# Patient Record
Sex: Female | Born: 1955 | Race: White | Hispanic: No | Marital: Married | Smoking: Never smoker
Health system: Southern US, Community
[De-identification: ages and names within clinical notes are randomized; demographics above are authoritative.]

## PROBLEM LIST (undated history)

## (undated) DIAGNOSIS — E569 Vitamin deficiency, unspecified: Secondary | ICD-10-CM

## (undated) DIAGNOSIS — Z789 Other specified health status: Secondary | ICD-10-CM

## (undated) DIAGNOSIS — C50919 Malignant neoplasm of unspecified site of unspecified female breast: Secondary | ICD-10-CM

## (undated) DIAGNOSIS — R232 Flushing: Secondary | ICD-10-CM

## (undated) HISTORY — PX: TONSILLECTOMY: SUR1361

## (undated) HISTORY — DX: Flushing: R23.2

## (undated) HISTORY — DX: Vitamin deficiency, unspecified: E56.9

## (undated) HISTORY — PX: TUBAL LIGATION: SHX77

## (undated) HISTORY — DX: Malignant neoplasm of unspecified site of unspecified female breast: C50.919

## (undated) HISTORY — PX: BREAST ENHANCEMENT SURGERY: SHX7

---

## 1986-02-12 HISTORY — PX: DILATION AND CURETTAGE OF UTERUS: SHX78

## 1997-06-24 ENCOUNTER — Ambulatory Visit (HOSPITAL_COMMUNITY): Admission: RE | Admit: 1997-06-24 | Discharge: 1997-06-24 | Payer: Self-pay | Admitting: Obstetrics and Gynecology

## 1997-11-25 ENCOUNTER — Other Ambulatory Visit: Admission: RE | Admit: 1997-11-25 | Discharge: 1997-11-25 | Payer: Self-pay | Admitting: Obstetrics and Gynecology

## 1999-03-20 ENCOUNTER — Other Ambulatory Visit: Admission: RE | Admit: 1999-03-20 | Discharge: 1999-03-20 | Payer: Self-pay | Admitting: Obstetrics and Gynecology

## 1999-05-01 ENCOUNTER — Other Ambulatory Visit: Admission: RE | Admit: 1999-05-01 | Discharge: 1999-05-01 | Payer: Self-pay | Admitting: Obstetrics and Gynecology

## 1999-07-04 ENCOUNTER — Ambulatory Visit (HOSPITAL_COMMUNITY): Admission: RE | Admit: 1999-07-04 | Discharge: 1999-07-04 | Payer: Self-pay | Admitting: Obstetrics and Gynecology

## 2000-04-23 ENCOUNTER — Encounter: Payer: Self-pay | Admitting: Obstetrics and Gynecology

## 2000-04-23 ENCOUNTER — Ambulatory Visit (HOSPITAL_COMMUNITY): Admission: RE | Admit: 2000-04-23 | Discharge: 2000-04-23 | Payer: Self-pay | Admitting: Obstetrics and Gynecology

## 2006-02-20 ENCOUNTER — Other Ambulatory Visit: Admission: RE | Admit: 2006-02-20 | Discharge: 2006-02-20 | Payer: Self-pay | Admitting: Obstetrics & Gynecology

## 2011-09-06 ENCOUNTER — Other Ambulatory Visit: Payer: Self-pay | Admitting: Obstetrics and Gynecology

## 2011-09-06 DIAGNOSIS — N63 Unspecified lump in unspecified breast: Secondary | ICD-10-CM

## 2011-09-11 ENCOUNTER — Other Ambulatory Visit: Payer: Self-pay | Admitting: Obstetrics and Gynecology

## 2011-09-11 ENCOUNTER — Ambulatory Visit
Admission: RE | Admit: 2011-09-11 | Discharge: 2011-09-11 | Disposition: A | Discharge status: Home / Self Care | Payer: PRIVATE HEALTH INSURANCE | Source: Ambulatory Visit | Attending: Obstetrics and Gynecology | Admitting: Obstetrics and Gynecology

## 2011-09-11 DIAGNOSIS — N63 Unspecified lump in unspecified breast: Secondary | ICD-10-CM

## 2011-09-20 ENCOUNTER — Other Ambulatory Visit: Payer: Self-pay | Admitting: Obstetrics and Gynecology

## 2011-09-20 ENCOUNTER — Ambulatory Visit
Admission: RE | Admit: 2011-09-20 | Discharge: 2011-09-20 | Disposition: A | Discharge status: Home / Self Care | Payer: PRIVATE HEALTH INSURANCE | Source: Ambulatory Visit | Attending: Obstetrics and Gynecology | Admitting: Obstetrics and Gynecology

## 2011-09-20 ENCOUNTER — Other Ambulatory Visit: Payer: Self-pay | Admitting: Diagnostic Radiology

## 2011-09-20 DIAGNOSIS — N63 Unspecified lump in unspecified breast: Secondary | ICD-10-CM

## 2011-09-20 DIAGNOSIS — C50919 Malignant neoplasm of unspecified site of unspecified female breast: Secondary | ICD-10-CM

## 2011-09-20 HISTORY — DX: Malignant neoplasm of unspecified site of unspecified female breast: C50.919

## 2011-09-20 HISTORY — PX: BREAST BIOPSY: SHX20

## 2011-09-21 ENCOUNTER — Other Ambulatory Visit: Payer: Self-pay | Admitting: Obstetrics and Gynecology

## 2011-09-21 ENCOUNTER — Ambulatory Visit
Admission: RE | Admit: 2011-09-21 | Discharge: 2011-09-21 | Disposition: A | Discharge status: Home / Self Care | Payer: PRIVATE HEALTH INSURANCE | Source: Ambulatory Visit | Attending: Obstetrics and Gynecology | Admitting: Obstetrics and Gynecology

## 2011-09-21 DIAGNOSIS — N63 Unspecified lump in unspecified breast: Secondary | ICD-10-CM

## 2011-09-21 DIAGNOSIS — C50911 Malignant neoplasm of unspecified site of right female breast: Secondary | ICD-10-CM

## 2011-09-24 ENCOUNTER — Telehealth: Payer: Self-pay | Admitting: *Deleted

## 2011-09-24 ENCOUNTER — Other Ambulatory Visit: Payer: Self-pay | Admitting: *Deleted

## 2011-09-24 DIAGNOSIS — C50419 Malignant neoplasm of upper-outer quadrant of unspecified female breast: Secondary | ICD-10-CM | POA: Insufficient documentation

## 2011-09-24 NOTE — Telephone Encounter (Signed)
Pt stated she would like to choose her own doctors.  Pt would like to see Dr. Jamey Ripa as her surgeon.  Informed [facility] of pt request.

## 2011-09-24 NOTE — Telephone Encounter (Signed)
Pt called stating she would like to come to clinic on 09/26/11 instead of waiting to see Dr. Jamey Ripa on 9/4.  Scheduled pt to come on 8/14 to [facility] at 0800

## 2011-09-25 ENCOUNTER — Ambulatory Visit
Admission: RE | Admit: 2011-09-25 | Discharge: 2011-09-25 | Disposition: A | Discharge status: Home / Self Care | Payer: PRIVATE HEALTH INSURANCE | Source: Ambulatory Visit | Attending: Obstetrics and Gynecology | Admitting: Obstetrics and Gynecology

## 2011-09-25 DIAGNOSIS — C50911 Malignant neoplasm of unspecified site of right female breast: Secondary | ICD-10-CM

## 2011-09-25 MED ORDER — GADOBENATE DIMEGLUMINE 529 MG/ML IV SOLN
11.0000 mL | Freq: Once | INTRAVENOUS | Status: AC | PRN
  Administered 2011-09-25: 11 mL via INTRAVENOUS

## 2011-09-26 ENCOUNTER — Ambulatory Visit: Payer: PRIVATE HEALTH INSURANCE

## 2011-09-26 ENCOUNTER — Ambulatory Visit (HOSPITAL_BASED_OUTPATIENT_CLINIC_OR_DEPARTMENT_OTHER): Payer: PRIVATE HEALTH INSURANCE | Admitting: Oncology

## 2011-09-26 ENCOUNTER — Ambulatory Visit: Payer: PRIVATE HEALTH INSURANCE | Attending: General Surgery | Admitting: Physical Therapy

## 2011-09-26 ENCOUNTER — Encounter: Payer: Self-pay | Admitting: *Deleted

## 2011-09-26 ENCOUNTER — Ambulatory Visit
Admission: RE | Admit: 2011-09-26 | Discharge: 2011-09-26 | Disposition: A | Discharge status: Home / Self Care | Payer: PRIVATE HEALTH INSURANCE | Source: Ambulatory Visit | Attending: Radiation Oncology | Admitting: Radiation Oncology

## 2011-09-26 ENCOUNTER — Encounter: Payer: Self-pay | Admitting: Oncology

## 2011-09-26 ENCOUNTER — Other Ambulatory Visit: Payer: PRIVATE HEALTH INSURANCE | Admitting: Lab

## 2011-09-26 ENCOUNTER — Ambulatory Visit: Payer: PRIVATE HEALTH INSURANCE | Admitting: Oncology

## 2011-09-26 ENCOUNTER — Ambulatory Visit (HOSPITAL_BASED_OUTPATIENT_CLINIC_OR_DEPARTMENT_OTHER): Payer: PRIVATE HEALTH INSURANCE | Admitting: General Surgery

## 2011-09-26 ENCOUNTER — Other Ambulatory Visit (HOSPITAL_BASED_OUTPATIENT_CLINIC_OR_DEPARTMENT_OTHER): Payer: PRIVATE HEALTH INSURANCE | Admitting: Lab

## 2011-09-26 VITALS — BP 121/71 | HR 77 | Temp 97.7°F | Resp 20 | Ht 64.0 in | Wt 122.1 lb

## 2011-09-26 DIAGNOSIS — C50419 Malignant neoplasm of upper-outer quadrant of unspecified female breast: Secondary | ICD-10-CM

## 2011-09-26 DIAGNOSIS — C50919 Malignant neoplasm of unspecified site of unspecified female breast: Secondary | ICD-10-CM

## 2011-09-26 DIAGNOSIS — Z170 Estrogen receptor positive status [ER+]: Secondary | ICD-10-CM

## 2011-09-26 DIAGNOSIS — IMO0001 Reserved for inherently not codable concepts without codable children: Secondary | ICD-10-CM | POA: Insufficient documentation

## 2011-09-26 DIAGNOSIS — M25619 Stiffness of unspecified shoulder, not elsewhere classified: Secondary | ICD-10-CM | POA: Insufficient documentation

## 2011-09-26 LAB — COMPREHENSIVE METABOLIC PANEL
ALT: 23 U/L (ref 0–35)
AST: 23 U/L (ref 0–37)
Albumin: 4 g/dL (ref 3.5–5.2)
Alkaline Phosphatase: 73 U/L (ref 39–117)
Glucose, Bld: 98 mg/dL (ref 70–99)
Potassium: 3.6 mEq/L (ref 3.5–5.3)
Sodium: 139 mEq/L (ref 135–145)
Total Bilirubin: 0.5 mg/dL (ref 0.3–1.2)
Total Protein: 7 g/dL (ref 6.0–8.3)

## 2011-09-26 LAB — CBC WITH DIFFERENTIAL/PLATELET
BASO%: 1 % (ref 0.0–2.0)
EOS%: 3.1 % (ref 0.0–7.0)
Eosinophils Absolute: 0.2 10*3/uL (ref 0.0–0.5)
LYMPH%: 29.6 % (ref 14.0–49.7)
MCHC: 34.9 g/dL (ref 31.5–36.0)
MCV: 90.9 fL (ref 79.5–101.0)
MONO%: 6.7 % (ref 0.0–14.0)
NEUT#: 3.3 10*3/uL (ref 1.5–6.5)
Platelets: 225 10*3/uL (ref 145–400)
RBC: 4.81 10*6/uL (ref 3.70–5.45)
RDW: 12.4 % (ref 11.2–14.5)

## 2011-09-26 NOTE — Progress Notes (Signed)
Radiation Oncology         [phone] ________________________________  Initial outpatient Consultation  Name: Andrea Garrett MRN: [id number]  Date: 09/26/2011  DOB: [birth year]  REFERRING PHYSICIAN: Mariella Saa, MD  DIAGNOSIS: Right breast Cancer (Stage II)  HISTORY OF PRESENT ILLNESS::Andrea Garrett is a 56 y.o. female  who presents with a palpable right breast mass. A mammogram was performed which showed subpectoral implants and a 1.9 cm mass. MRI of the bilateral breasts was performed which confirmed the presence of a 2.4 cm mass in the upper outer quadrant measuring 2.4 x 2.2 x 1.4 cm. The mass was located 6 mm anterior to the implant. No other abnormalities were noted within the right or left breast. No lymphadenopathy. 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 She states she's had a little soreness from her biopsy but other than a asymptomatic. She denies any headaches bone pain or lower sternum and the edema. She is accompanied by her husband. Her husband did state she is very afraid of the effects of chemotherapy and radiation on normal tissues.  PREVIOUS RADIATION THERAPY: No  PAST MEDICAL HISTORY:  has a past medical history of Breast cancer and Hot flashes.    PAST SURGICAL HISTORY: Past Surgical History  Procedure Date  . Tonsillectomy   . Cesarean section     x2  . Breast enhancement surgery     Bilateral    FAMILY HISTORY: family history includes Breast cancer in her maternal aunt.  SOCIAL HISTORY:  reports that she has never smoked. She does not have any smokeless tobacco history on file. She reports that she does not drink alcohol or use illicit drugs.  ALLERGIES: Review of patient's allergies indicates no known allergies.  MEDICATIONS:  Current Outpatient Prescriptions  Medication Sig Dispense Refill  . estrogens,  conjugated, (PREMARIN) 0.3 MG tablet Take 0.3 mg by mouth daily. Take daily for 21 days then do not take for 7 days.        REVIEW OF SYSTEMS:  A 15 point review of systems is documented in the electronic medical record. This was obtained by the nursing staff. However, I reviewed this with the patient to discuss relevant findings and make appropriate changes.  Pertinent items are noted in HPI.   PHYSICAL EXAM:  vitals were not taken for this visit.  She is a pleasant female who appears her stated age sitting comfortably examining table. She has bilateral implants. There is a palpable right breast mass at approximately the 11 to 12:00 position of the right breast. There is no palpable axillary or supraclavicular adenopathy.  LABORATORY DATA:  Lab Results  Component Value Date   WBC 5.5 09/26/2011   HGB 15.2 09/26/2011   HCT 43.7 09/26/2011   MCV 90.9 09/26/2011   PLT 225 09/26/2011   Lab Results  Component Value Date   NA 139 09/26/2011   K 3.6 09/26/2011   CL 101 09/26/2011   CO2 29 09/26/2011   Lab Results  Component Value Date   ALT 23 09/26/2011   AST 23 09/26/2011   ALKPHOS 73 09/26/2011   BILITOT 0.5 09/26/2011     RADIOGRAPHY: US Breast Right  09/11/2011  *RADIOLOGY REPORT*  Clinical Data:  Palpable lump in the upper right breast.  Annual reevaluation, left breast.  DIGITAL DIAGNOSTIC BILATERAL MAMMOGRAM WITH CAD  AND RIGHT BREAST ULTRASOUND:  Comparison:  04/23/2000.  No interval mammography.  Findings:  Standard and implant displaced CC and MLO views of both breasts and a spot tangential view of the right breast in the area of palpable concern were obtained.  In the area of palpable concern there is an approximate 2 cm mass with poorly circumscribed margins, new since the prior examination.  No new mass, nonsurgical architectural distortion, or suspicious calcifications were identified elsewhere in either breast.  No evidence of implant rupture. Mammographic images were processed with CAD.   On physical exam, I palpate a firm mass in the 12 o'clock position of the right breast, approximately 5 cm from the nipple.  Ultrasound is performed, showing a vague heterogeneous solid mass with poorly visible margins at the 12 o'clock position of the right breast, 5 cm from the nipple, sitting upon the right pectoralis major muscle.  There is no associated acoustic shadowing.  The mass measures approximately 1.9 x 1.0 x 1.7 cm.  IMPRESSION:  1.  Indeterminate mass at the 12 o'clock position of the right breast, 5 cm from the nipple, corresponding to the palpable abnormality. 2.  No mammographic evidence of malignancy, left breast.  RECOMMENDATION: Ultrasound-guided core needle biopsy of the right breast mass versus short-term interval follow-up was discussed with the patient and her husband.  The patient has elected to proceed with biopsy which has been scheduled for Thursday, August 8, at 10 o'clock a.m.  BI-RADS CATEGORY 4:  Suspicious abnormality - biopsy should be considered.  Results were discussed by telephone with Dr. Tresa Res on 09/11/2011 at 1205 hours.  Original Report Authenticated By: Arnell Sieving, M.D.   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  Presurgical evaluation.  BILATERAL BREAST MRI WITH AND WITHOUT CONTRAST  Technique: Multiplanar, multisequence MR images of both breasts were obtained prior to and following the intravenous administration of 11ml of Multihance.  Three dimensional images were evaluated at the independent DynaCad workstation.  Comparison:  Mammograms dated 09/20/2011 and 09/11/2011.  Findings: There is mild to moderate diffuse bilateral parenchymal background enhancement.  There are bilateral submuscular silicone breast implants present.  There is intracapsular rupture of the left breast implant noted.  There is an irregular enhancing mass located within the  right breast at the 12 o'clock position with central clip artifact corresponding to the recently diagnosed invasive ductal carcinoma with DCIS.  This measures 2.4 x 2.2 x 1.4 cm in size and is associated with plateau enhancement kinetics.  The mass is located 6 mm anterior to the breast implant.  There are no additional worrisome enhancing foci within either breast and there is no evidence for axillary or internal mammary adenopathy.  There are no additional findings.  IMPRESSION:  1.  2.4 cm enhancing mass located within the right breast at the 12 o'clock position with central clip artifact corresponding to the recently diagnosed invasive ductal carcinoma with DCIS.  No evidence for axillary or internal mammary adenopathy.  2.  Bilateral subpectoral silicone breast implants with findings consistent with intracapsular rupture of the left breast implant.  RECOMMENDATION: Treatment plan  THREE-DIMENSIONAL MR IMAGE RENDERING ON INDEPENDENT WORKSTATION:  Three-dimensional MR images were rendered by post-processing of the original MR data on an independent workstation.  The three- dimensional MR images were interpreted, and findings were reported in the accompanying complete MRI report for this study.  BI-RADS CATEGORY 6:  Known biopsy-proven malignancy - appropriate action should be taken.  Original Report Authenticated By: Rolla Plate, M.D.   [place] Core Biopsy  09/20/2011  *RADIOLOGY REPORT*  Clinical Data:  Palpable right breast mass.  ULTRASOUND GUIDED CORE BIOPSY OF THE right BREAST  Comparison: Previous exams.  I met with the patient and we discussed the procedure of ultrasound- guided biopsy, including benefits and alternatives.  We discussed the high likelihood of a successful procedure. We discussed the risks of the procedure, including infection, bleeding, tissue injury, clip migration, and inadequate sampling.  Informed written consent was given.  Using sterile technique 2% lidocaine, ultrasound guidance  and a 14 gauge automated biopsy device, biopsy was performed of the mass using a lateral approach.  At the conclusion of the procedure a ribbon tissue marker clip was deployed into the biopsy cavity. Follow up 2 view mammogram was performed and dictated separately.  IMPRESSION: Ultrasound guided biopsy of a right breast mass.  No apparent complications.  Original Report Authenticated By: Hiram Gash, M.D.   Mm Digital Diagnostic Bilat  09/11/2011  *RADIOLOGY REPORT*  Clinical Data:  Palpable lump in the upper right breast.  Annual reevaluation, left breast.  DIGITAL DIAGNOSTIC BILATERAL MAMMOGRAM WITH CAD AND RIGHT BREAST ULTRASOUND:  Comparison:  04/23/2000.  No interval mammography.  Findings:  Standard and implant displaced CC and MLO views of both breasts and a spot tangential view of the right breast in the area of palpable concern were obtained.  In the area of palpable concern there is an approximate 2 cm mass with poorly circumscribed margins, new since the prior examination.  No new mass, nonsurgical architectural distortion, or suspicious calcifications were identified elsewhere in either breast.  No evidence of implant rupture. Mammographic images were processed with CAD.  On physical exam, I palpate a firm mass in the 12 o'clock position of the right breast, approximately 5 cm from the nipple.  Ultrasound is performed, showing a vague heterogeneous solid mass with poorly visible margins at the 12 o'clock position of the right breast, 5 cm from the nipple, sitting upon the right pectoralis major muscle.  There is no associated acoustic shadowing.  The mass measures approximately 1.9 x 1.0 x 1.7 cm.  IMPRESSION:  1.  Indeterminate mass at the 12 o'clock position of the right breast, 5 cm from the nipple, corresponding to the palpable abnormality. 2.  No mammographic evidence of malignancy, left breast.  RECOMMENDATION: Ultrasound-guided core needle biopsy of the right breast mass versus  short-term interval follow-up was discussed with the patient and her husband.  The patient has elected to proceed with biopsy which has been scheduled for Thursday, August 8, at 10 o'clock a.m.  BI-RADS CATEGORY 4:  Suspicious abnormality - biopsy should be considered.  Results were discussed by telephone with Dr. Tresa Res on 09/11/2011 at 1205 hours.  Original Report Authenticated By: Arnell Sieving, M.D.   Mm Digital Diagnostic Unilat R  09/20/2011  *RADIOLOGY REPORT*  Clinical Data:  Right breast mass  DIGITAL DIAGNOSTIC RIGHT MAMMOGRAM  Comparison:  Previous exams.  Findings:  Films are performed following ultrasound guided biopsy of right breast mass.  Images show a ribbon type clip in association with a mass in the upper central aspect of the right breast.  IMPRESSION: Adequate clip placement following ultrasound guided right breast biopsy.  Original Report Authenticated By: Hiram Gash, M.D.   Mm Radiologist Eval And Mgmt  09/24/2011  **ADDENDUM** CREATED: 09/24/2011 13:59:05  Final pathology demonstrates both  INVASIVE AND IN SITU DUCTAL CARCINOMA. Histology correlates with imaging findings.  These results were given to the patient by phone which she understood. Her questions were answered. The patient had no complaints with her biopsy site.  Recommend surgery - oncology consultation.  The patient is deciding whether she will attend the [facility]isciplinary [facility] on 09/26/11 or a scheduled appointment with Dr. Jamey Ripa on 10/17/2011.  **END ADDENDUM** SIGNED BY: Tinnie Gens T. Si Gaul, M.D.   09/21/2011  *RADIOLOGY REPORT*  ESTABLISHED PATIENT OFFICE VISIT - LEVEL II [phone])  Chief Complaint:  Right breast mass.  History:  Right breast mass which the patient underwent an ultrasound guided right breast biopsy.  Exam:  The biopsy site is clean and dry with an area of adjacent ecchymosis measuring less than 1 cm.  No erythema is present.  The the patient reports no tenderness.  Pathology: Pathology reveals at  least invasive ductal carcinoma in situ, concordant with imaging findings.  The deeper levels are being done to evaluate for invasive disease.  Assessment and Plan:  A breast MRI is scheduled for 09/25/2011 at 8:45 a.m.  The patient will be seen at Multidisciplinary Clinic on 09/26/2011.  Original Report Authenticated By: Rosendo Gros, M.D.      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 We discussed the randomized trial showing equivalency in terms of survival between radiation and mastectomy. We discussed the role of radiation and decreasing local failures after lumpectomy. We discussed the treatment of normal breast tissue which may have cells which are susceptible to becoming cancers. We discussed that chemotherapy decisions were made based on the likelihood of cancer spreading to other parts of the body. She is going to Dr. to Dr. Johna Sheriff about the need for neoadjuvant treatment. She does appear to have very little breast tissue so she may require some form of neoadjuvant treatment to make her surgery better. She is very afraid of the effects on normal tissue of chemotherapy and radiation. We talked a lot about fractionation and palliative treatments over 6 weeks not because his more radiation but it does allow the process of normal cells to heal. If she does not wish to receive radiation I told her she have a mastectomy and possibly immediate reconstruction. Again this would not preclude any decisions regarding systemic therapy. Her tumor will be suffered Oncotype she does not like for neoadjuvant treatment. We discussed lung heart and rib damage. We discussed the low likelihood of these effects. We discussed skin reaction and fibrosis. She does have one maternal aunt with breast cancer at 67 but I do not feel she would benefit from genetic testing. She has 2 sons and again not a very extensive family history. This point I  think she is undecided is what she would like to do. I told her I be happy to see her back after surgery we'll start radiation about 4 weeks after surgery. We briefly discussed hypofractionation and we discussed that this wasn't last treatment it was just the same treatment in a short amount of time.  I spent 60 minutes  face to face with the patient and more than 50% of that time was spent in counseling and/or coordination of care.   ------------------------------------------------  Lurline Hare, MD

## 2011-09-26 NOTE — Progress Notes (Signed)
Andrea Garrett [id number] 09/08/1955 56 y.o. 09/26/2011 11:43 AM  CC Dr Aram Beecham Romine  REASON FOR CONSULTATION:   Breast cancer Patient was seen in the Multidisciplinary Breast Clinic for discussion of her treatment options. She was seen by Dr. Pierce Crane , Radiation Onc[facility]  STAGE:   Cancer of upper-outer quadrant of female breast   Primary site: Breast (Right)   Staging method: AJCC 7th Edition   Clinical: Stage IA (T1c, N0, cM0)   Summary: Stage IA (T1c, N0, cM0)  REFERRING PHYSICIAN: Dr. Aram Beecham Romine  HISTORY OF PRESENT ILLNESS:  Andrea Garrett is a 56 y.o. female.  From [facility] who is referred for evaluation of a recently diagnosed breast cancer. This patient has been in excellent health. She has not had a mammogram in some time or at least for 10 years. She detected a mass in her right breast about a month ago. She subsequently sought medical attention for this he diagnostic mammogram right breast ultrasound performed 09/11/2011 showed a mass at the 12:00 position 5 cm from nipple measuring 1.9 x 1.7 x 1 cm. A biopsy was performed on 09/20/2011 and this showed a invasive ductal cancer, grade 1/2. This was ER +90% PR +90% and HER-2 was nonamplified. Ki-67 was low. An MRI scan was performed on 09/25/2011 and this showed a mass measuring 2.4 x 2.2 x 1.4 cm. No other abnormalities were seen.   Past Medical History: Past Medical History  Diagnosis Date  . Breast cancer   . Hot flashes    She is in excellent health and does not have a colonoscopy. Her bone density test is out of date as well. She has had a Pap smear in 2012. Past Surgical History: Past Surgical History  Procedure Date  . Tonsillectomy   . Cesarean section     x2  . Breast enhancement surgery 1982    Bilateral    Family History: Family History  Problem Relation Age of Onset  . Breast cancer Maternal Aunt 58    Social History History  Substance Use  Topics  . Smoking status: Never Smoker   . Smokeless tobacco: Not on file  . Alcohol Use: No   she has been married for 35 years they have 2 children ages 50 and 38 , ). 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  Allergies: No Known Allergies  Current Medications: Current Outpatient Prescriptions  Medication Sig Dispense Refill  . estrogens, conjugated, (PREMARIN) 0.3 MG tablet Take 0.3 mg by mouth daily. Take daily for 21 days then do not take for 7 days.        OB/GYN History: G3 P2 menarche age 47, last menses 2007 has been on Premarin vaginal cream since then for mostly for vaginal dryness.   Fertility Discussion:NA  Prior History of Cancer:NA  Health Maintenance:  Colonoscopy no Bone Density   no Last PAP smear 2012  ECOG PERFORMANCE STATUS: 0 - Asymptomatic  Genetic Counseling/testing: Referred  REVIEW OF SYSTEMS:  A comprehensive review of systems was negative.  PHYSICAL EXAMINATION: Blood pressure 121/71, pulse 77, temperature 97.7 F (36.5 C), resp. rate 20, height 5\' 4"  (1.626 m), weight 122 lb 1.6 oz (55.384 kg).  HEENT:  Sclerae anicteric, conjunctivae pink.  Oropharynx clear.  No mucositis or candidiasis.  Nodes:  No cervical, supraclavicular, or axillary lymphadenopathy palpated.  Breast Exam:  Right breast has ecchymoses and a vague palpable mass measuring about 2  cm. .  Left breast is benign.  No masses, discharge, skin change, or nipple inversion..  Lungs:  Clear to auscultation bilaterally.  No crackles, rhonchi, or wheezes.  Heart:  Regular rate and rhythm.  Abdomen:  Soft, nontender.  Positive bowel sounds.  No organomegaly or masses palpated.  Musculoskeletal:  No focal spinal tenderness to palpation.  Extremities:  Benign.  No peripheral edema or cyanosis.  Skin:  Benign.  Neuro:  Nonfocal.      STUDIES/RESULTS: US Breast Right  09/11/2011  *RADIOLOGY REPORT*  Clinical Data:  Palpable lump in the upper right  breast.  Annual reevaluation, left breast.  DIGITAL DIAGNOSTIC BILATERAL MAMMOGRAM WITH CAD AND RIGHT BREAST ULTRASOUND:  Comparison:  04/23/2000.  No interval mammography.  Findings:  Standard and implant displaced CC and MLO views of both breasts and a spot tangential view of the right breast in the area of palpable concern were obtained.  In the area of palpable concern there is an approximate 2 cm mass with poorly circumscribed margins, new since the prior examination.  No new mass, nonsurgical architectural distortion, or suspicious calcifications were identified elsewhere in either breast.  No evidence of implant rupture. Mammographic images were processed with CAD.  On physical exam, I palpate a firm mass in the 12 o'clock position of the right breast, approximately 5 cm from the nipple.  Ultrasound is performed, showing a vague heterogeneous solid mass with poorly visible margins at the 12 o'clock position of the right breast, 5 cm from the nipple, sitting upon the right pectoralis major muscle.  There is no associated acoustic shadowing.  The mass measures approximately 1.9 x 1.0 x 1.7 cm.  IMPRESSION:  1.  Indeterminate mass at the 12 o'clock position of the right breast, 5 cm from the nipple, corresponding to the palpable abnormality. 2.  No mammographic evidence of malignancy, left breast.  RECOMMENDATION: Ultrasound-guided core needle biopsy of the right breast mass versus short-term interval follow-up was discussed with the patient and her husband.  The patient has elected to proceed with biopsy which has been scheduled for Thursday, August 8, at 10 o'clock a.m.  BI-RADS CATEGORY 4:  Suspicious abnormality - biopsy should be considered.  Results were discussed by telephone with Dr. Tresa Res on 09/11/2011 at 1205 hours.  Original Report Authenticated By: Arnell Sieving, M.D.   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  Presurgical evaluation.  BILATERAL BREAST MRI WITH AND WITHOUT CONTRAST  Technique: Multiplanar, multisequence MR images of both breasts were obtained prior to and following the intravenous administration of 11ml of Multihance.  Three dimensional images were evaluated at the independent DynaCad workstation.  Comparison:  Mammograms dated 09/20/2011 and 09/11/2011.  Findings: There is mild to moderate diffuse bilateral parenchymal background enhancement.  There are bilateral submuscular silicone breast implants present.  There is intracapsular rupture of the left breast implant noted.  There is an irregular enhancing mass located within the right breast at the 12 o'clock position with central clip artifact corresponding to the recently diagnosed invasive ductal carcinoma with DCIS.  This measures 2.4 x 2.2 x 1.4 cm in size and is associated with plateau enhancement kinetics.  The mass is located 6 mm anterior to the breast implant.  There are no additional worrisome enhancing foci within either breast and there is no evidence for axillary or internal mammary adenopathy.  There are no additional findings.  IMPRESSION:  1.  2.4 cm enhancing mass located within the right breast at the 12 o'clock position with central clip artifact corresponding to the recently diagnosed invasive ductal carcinoma with DCIS.  No evidence for axillary or internal mammary adenopathy.  2.  Bilateral subpectoral silicone breast implants with findings consistent with intracapsular rupture of the left breast implant.  RECOMMENDATION: Treatment plan  THREE-DIMENSIONAL MR IMAGE RENDERING ON INDEPENDENT WORKSTATION:  Three-dimensional MR images were rendered by post-processing of the original MR data on an independent workstation.  The three- dimensional MR images were interpreted, and findings were reported in the accompanying complete MRI report for this study.  BI-RADS CATEGORY 6:  Known biopsy-proven malignancy -  appropriate action should be taken.  Original Report Authenticated By: Rolla Plate, M.D.   [place] Core Biopsy  09/20/2011  *RADIOLOGY REPORT*  Clinical Data:  Palpable right breast mass.  ULTRASOUND GUIDED CORE BIOPSY OF THE right BREAST  Comparison: Previous exams.  I met with the patient and we discussed the procedure of ultrasound- guided biopsy, including benefits and alternatives.  We discussed the high likelihood of a successful procedure. We discussed the risks of the procedure, including infection, bleeding, tissue injury, clip migration, and inadequate sampling.  Informed written consent was given.  Using sterile technique 2% lidocaine, ultrasound guidance and a 14 gauge automated biopsy device, biopsy was performed of the mass using a lateral approach.  At the conclusion of the procedure a ribbon tissue marker clip was deployed into the biopsy cavity. Follow up 2 view mammogram was performed and dictated separately.  IMPRESSION: Ultrasound guided biopsy of a right breast mass.  No apparent complications.  Original Report Authenticated By: Hiram Gash, M.D.   Mm Digital Diagnostic Bilat  09/11/2011  *RADIOLOGY REPORT*  Clinical Data:  Palpable lump in the upper right breast.  Annual reevaluation, left breast.  DIGITAL DIAGNOSTIC BILATERAL MAMMOGRAM WITH CAD AND RIGHT BREAST ULTRASOUND:  Comparison:  04/23/2000.  No interval mammography.  Findings:  Standard and implant displaced CC and MLO views of both breasts and a spot tangential view of the right breast in the area of palpable concern were obtained.  In the area of palpable concern there is an approximate 2 cm mass with poorly circumscribed margins, new since the prior examination.  No new mass, nonsurgical architectural distortion, or suspicious calcifications were identified elsewhere in either breast.  No evidence of implant rupture. Mammographic images were processed with CAD.  On physical exam, I palpate a firm mass in the 12 o'clock  position of the right breast, approximately 5 cm from the nipple.  Ultrasound is performed, showing a vague heterogeneous solid mass with poorly visible margins at the 12 o'clock position of the right breast, 5 cm from the nipple, sitting upon the right pectoralis major muscle.  There is no associated acoustic shadowing.  The mass measures approximately 1.9 x 1.0 x 1.7 cm.  IMPRESSION:  1.  Indeterminate mass at the 12 o'clock position of the right breast, 5 cm from the nipple, corresponding to the palpable abnormality. 2.  No mammographic evidence of malignancy, left breast.  RECOMMENDATION: Ultrasound-guided core needle biopsy of the right breast mass versus short-term interval follow-up was discussed with the patient and her husband.  The patient has elected to proceed with biopsy which has been scheduled for Thursday, August 8, at 10 o'clock a.m.  BI-RADS CATEGORY 4:  Suspicious abnormality - biopsy should be considered.  Results were discussed by telephone with Dr. Tresa Res on 09/11/2011  at 1205 hours.  Original Report Authenticated By: Arnell Sieving, M.D.   Mm Digital Diagnostic Unilat R  09/20/2011  *RADIOLOGY REPORT*  Clinical Data:  Right breast mass  DIGITAL DIAGNOSTIC RIGHT MAMMOGRAM  Comparison:  Previous exams.  Findings:  Films are performed following ultrasound guided biopsy of right breast mass.  Images show a ribbon type clip in association with a mass in the upper central aspect of the right breast.  IMPRESSION: Adequate clip placement following ultrasound guided right breast biopsy.  Original Report Authenticated By: Hiram Gash, M.D.   Mm Radiologist Eval And Mgmt  09/24/2011  **ADDENDUM** CREATED: 09/24/2011 13:59:05  Final pathology demonstrates both INVASIVE AND IN SITU DUCTAL CARCINOMA. Histology correlates with imaging findings.  These results were given to the patient by phone which she understood. Her questions were answered. The patient had no complaints with her biopsy  site.  Recommend surgery - oncology consultation.  The patient is deciding whether she will attend the Multidisciplinary Clinic on 09/26/11 or a scheduled appointment with Dr. Jamey Ripa on 10/17/2011.  **END ADDENDUM** SIGNED BY: Tinnie Gens T. Si Gaul, M.D.   09/21/2011  *RADIOLOGY REPORT*  ESTABLISHED PATIENT OFFICE VISIT - LEVEL II [phone])  Chief Complaint:  Right breast mass.  History:  Right breast mass which the patient underwent an ultrasound guided right breast biopsy.  Exam:  The biopsy site is clean and dry with an area of adjacent ecchymosis measuring less than 1 cm.  No erythema is present.  The the patient reports no tenderness.  Pathology: Pathology reveals at least invasive ductal carcinoma in situ, concordant with imaging findings.  The deeper levels are being done to evaluate for invasive disease.  Assessment and Plan:  A breast MRI is scheduled for 09/25/2011 at 8:45 a.m.  The patient will be seen at Multidisciplinary Clinic on 09/26/2011.  Original Report Authenticated By: Rosendo Gros, M.D.     LABS:    Chemistry      Component Value Date/Time   NA 139 09/26/2011 0822   K 3.6 09/26/2011 0822   CL 101 09/26/2011 0822   CO2 29 09/26/2011 0822   BUN 11 09/26/2011 0822   CREATININE 0.75 09/26/2011 0822      Component Value Date/Time   CALCIUM 9.6 09/26/2011 0822   ALKPHOS 73 09/26/2011 0822   AST 23 09/26/2011 0822   ALT 23 09/26/2011 0822   BILITOT 0.5 09/26/2011 0822      Lab Results  Component Value Date   WBC 5.5 09/26/2011   HGB 15.2 09/26/2011   HCT 43.7 09/26/2011   MCV 90.9 09/26/2011   PLT 225 09/26/2011       PATHOLOGY:As above  ASSESSMENT    Postmenopausal woman who presents with ER/PR positive breast cancer.  Clinical Trial Eligibility:Yes  Multidisciplinary conference discussion; yes    PLAN:    Get a fairly lengthy discussion today about the natural history of her breast cancer the biology underlying the hormone receptor positive status of her cancer in the  theoretical and rash between estrogen and breast cancer. We discussed the possibility of downstaging her cancer given the relatively small breast size as well as the size of recurrent cancer and the possible cosmetic effect if she were to undergo lumpectomy up front. I offered her the option of downstaging with antiestrogen therapy. I also outlined a research study that is currently ongoing and [facility] [address] in the study patients to have an Oncotype test performed on the biopsy material. If the tests  were as well as the patient's get hormonal therapy that side a get chemotherapy and if it's in the middle range they would get randomized to receive either chemotherapy or hormonal therapy. We discussed followup plans we've not time on treatment and expectations and to down stage her tumor. She is interested in breast conservation that. She is leaning towards being involved in this setting so we'll get a research nurses to proctor her. I will schedule her to be seen after she's noted decision.        Discussion: Patient is being treated per NCCN breast cancer care guidelines appropriate for stage.II   Thank you so much for allowing me to participate in the care of Andrea Garrett. I will continue to follow up the patient with you and assist in her care.  All questions were answered. The patient knows to call the clinic with any problems, questions or concerns. We can certainly see the patient much sooner if necessary.  I spent 30 minutes counseling the patient face to face. The total time spent in the appointment was 60 minutes.  Pierce Crane M.D. FRCP C.  09/26/2011, 11:43 AM

## 2011-09-26 NOTE — Progress Notes (Signed)
[facility] Psychosocial Distress Screening Clinical Social Work  Patient completed distress screening protocol, and scored a 6 on the Psychosocial Distress Thermometer which indicates moderate distress. Clinical Social Worker met with patient in [facility] to assess for distress and other psychosocial needs.  Pt stated she was feeling less anxious after speaking with the physicians and clinic staff.  Pt did not wish to participate in reach to recovery at this time.  CSW provided pt with information on the [facility] support team and support services, and encouraged her to call with any questions or concerns.      Clinical Social Worker follow up needed: not at this time  Tamala Julian, MSW, LCSW Clinical Social Worker [facility] [phone]

## 2011-09-26 NOTE — Progress Notes (Signed)
Patient came in today as a new patient with her husband and she has one insurance,she said that she will be [place] far as financial assistance.

## 2011-09-26 NOTE — Progress Notes (Signed)
Mailed after appt letter to pt. 

## 2011-09-26 NOTE — Progress Notes (Signed)
Subjective:   new diagnosis right breast cancer  Patient ID: Andrea Garrett, female   DOB: [birth year], 56 y.o.   MRN: [id number]  HPI: Patient is a 56 year old female who recently while showering noticed a lump in her upper right breast. She contacted her gynecologist and was referred to the breast center for further evaluation and care. Bilateral diagnostic mammogram was performed which revealed an approximately 2 cm poorly circumscribed mass at the 12:00 position in the right breast. The patient has subpectoral implants which were noted as well. Ultrasound was performed revealing a 1.9 cm indistinct mass at the 12:00 position of the right breast 5 cm from the nipple. Ultrasound large core biopsy was performed which has revealed a grade 1-2 invasive ductal carcinoma, HER-2-negative and ER/PR positive.  Subsequent MRI was performed. On this study the mass appears slightly larger at a maximum of 2.4 cm. It is somewhat close to the chest wall at about 6 mm and in a fairly thin area of breast tissue. There were no other worrisome lesions or any evidence of abnormal lymph nodes. The patient was then referred to the multidisciplinary clinic by Dr. Guinevere Ferrari for further treatment planning. The patient has been on hormonal replacement therapy which she has been advised to discontinue. She has no previous history of significant breast disease or breast biopsies and early family history of breast cancer is a maternal aunt. She has no breast pain or nipple discharge.  Past Medical History  Diagnosis Date  . Breast cancer   . Hot flashes    Past Surgical History  Procedure Date  . Tonsillectomy   . Cesarean section     x2  . Breast enhancement surgery     Bilateral   Current Outpatient Prescriptions  Medication Sig Dispense Refill  . estrogens, conjugated, (PREMARIN) 0.3 MG tablet Take 0.3 mg by mouth daily. Take daily for 21 days then do not take for 7 days.       No Known Allergies History  Substance  Use Topics  . Smoking status: Never Smoker   . Smokeless tobacco: Not on file  . Alcohol Use: No    HPI   Review of Systems  Constitutional: Negative.   HENT: Negative.   Respiratory: Negative.   Cardiovascular: Negative.   Gastrointestinal: Negative.        Objective:   Physical Exam General: Alert, well-developed Caucasian female, in no distress Skin: Warm and dry without rash or infection. HEENT: No palpable masses or thyromegaly. Sclera nonicteric. Pupils equal round and reactive. Oropharynx clear. Breasts: Some bruising and thickening in the upper right breast and probably subtle palpable mass at the 12:00 position. No other abnormalities in either breast. As below no adenopathy. Lymph nodes: No cervical, supraclavicular, or inguinal nodes palpable. Lungs: Breath sounds clear and equal without increased work of breathing Cardiovascular: Regular rate and rhythm without murmur. No JVD or edema. Peripheral pulses intact. Extremities: No edema or joint swelling or deformity. No chronic venous stasis changes. Neurologic: Alert and fully oriented. Gait normal.     Assessment:     New diagnosis of approximately 2.4 cm low-grade ER PR positive invasive ductal carcinoma of the right breast. Patient had subpectoral implants. Her tumor is not particularly large but there is a fairly thin rim of breast tissue in this area. I think we have options to proceed with immediate lumpectomy but if we could reduce the size of her tumor with neoadjuvant treatment I believe we would have more likely success  with clear margins and less cosmetic deformity. Options were discussed with the patient and her husband extensively today. Options of total mastectomy versus breast conservation were discussed. They understand that radiation would be necessary with breast conservation but not total mastectomy. We discussed options for reconstruction and she preferred total mastectomy. I believe she would be a  candidate for breast conservation and I would favor neoadjuvant treatment. They're leaning toward this option and would like to consider this before making a final decision. We discussed that I would need to see her back in the office in several months to assess her clinical response and she would of course be followed closely by Dr. Donnie Coffin. We discussed the procedure of sentinel lymph node biopsy including its indications and risks of lymphedema and bleeding and infection at the time of her lumpectomy. We discussed the possibility that further surgery would be necessary potentially based on positive margins. All their questions were answered to her    Plan:     Likely neoadjuvant treatment with chemotherapy or hormonal therapy with subsequent lumpectomy and sentinel lymph node biopsy. I'll plan to see her back in the office know later than 3 months  Mariella Saa MD, FACS  09/26/2011, 12:08 PM

## 2011-09-28 ENCOUNTER — Telehealth: Payer: Self-pay | Admitting: *Deleted

## 2011-09-28 ENCOUNTER — Encounter: Payer: Self-pay | Admitting: *Deleted

## 2011-09-28 NOTE — Progress Notes (Signed)
Faxed Oncotype Dx requisition to pathology.

## 2011-09-28 NOTE — Telephone Encounter (Signed)
Per orders from 09-28-2011 placed patient on dr.rubin schedule for 10-05-2011 at 1:00pm

## 2011-09-28 NOTE — Telephone Encounter (Signed)
Spoke to pt concerning [facility] from 09/26/11.  Pt relate she has decided to have neoadjuvant hormones instead of participating in the the neoadjuvant study.  I have informed her physician team of her decision.  She denies questions or concerns regarding dx or treatment care plan.  Scheduled pt to see Dr. Donnie Coffin on 8/23 for f/u and further discussion of treatment.  Gave pt date and time.  Encourage pt to call with needs.  Received verbal understanding.  Contact information given.

## 2011-10-03 ENCOUNTER — Encounter: Payer: Self-pay | Admitting: Dietician

## 2011-10-03 NOTE — Progress Notes (Signed)
Brief Out-patient Oncology Nutrition Note  Reason: Patient attended breast cancer nutrition course.   I have educated the patient on plant-based diet. I also discussed and provided nutrition handouts and research based evidence on breast cancer nutrition. We discussed nutrition management for symptoms associated with treatment. The class lasted a duration of 1 hour. The patient's asked good questions and were without any further nutrition related questions or concerns. RD contact information was provided. Patient's were instructed to contact RD for future nutrition questions or concerns.   RD available for nutrition needs.   Paris [place] D Jeromie Gainor, MS, RD, LDN [phone]  

## 2011-10-05 ENCOUNTER — Telehealth: Payer: Self-pay | Admitting: *Deleted

## 2011-10-05 ENCOUNTER — Encounter: Payer: Self-pay | Admitting: *Deleted

## 2011-10-05 ENCOUNTER — Ambulatory Visit (HOSPITAL_BASED_OUTPATIENT_CLINIC_OR_DEPARTMENT_OTHER): Payer: PRIVATE HEALTH INSURANCE | Admitting: Oncology

## 2011-10-05 VITALS — BP 111/66 | HR 63 | Temp 98.2°F | Resp 20 | Ht 64.0 in | Wt 121.5 lb

## 2011-10-05 DIAGNOSIS — C50419 Malignant neoplasm of upper-outer quadrant of unspecified female breast: Secondary | ICD-10-CM

## 2011-10-05 DIAGNOSIS — Z170 Estrogen receptor positive status [ER+]: Secondary | ICD-10-CM

## 2011-10-05 DIAGNOSIS — C50919 Malignant neoplasm of unspecified site of unspecified female breast: Secondary | ICD-10-CM

## 2011-10-05 MED ORDER — LETROZOLE 2.5 MG PO TABS: 2.5000 mg | ORAL_TABLET | Freq: Every day | ORAL | Status: AC

## 2011-10-05 NOTE — Progress Notes (Signed)
RECEIVED A FAX FROM [facility] CONCERNING A PRIOR AUTHORIZATION FOR LETROZOLE. THIS REQUEST WAS PLACED IN THE MANAGED CARE BIN.

## 2011-10-05 NOTE — Telephone Encounter (Signed)
Made patient appoitment for 11-15-2011 starting at 10:00am

## 2011-10-05 NOTE — Progress Notes (Signed)
Hematology and Oncology Follow Up Visit  Andrea Garrett [id number] [birth year] 56 y.o. 10/05/2011 2:09 PM   DIAGNOSIS:   Encounter Diagnosis  Name Primary?  . 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 She elected not to enroll in study. She has had an Oncotype test that regardless.  Medications: I have reviewed the patient's current medications.  Allergies: No Known Allergies  Past Medical History, Surgical history, Social history, and Family History were reviewed and updated.  Review of Systems: Constitutional:  Negative for fever, chills, night sweats, anorexia, weight loss, pain. Cardiovascular: no chest pain or dyspnea on exertion Respiratory: negative Neurological: negative Dermatological: negative ENT: negative Skin Gastrointestinal: negative Genito-Urinary: negative Hematological and Lymphatic: negative Breast: negative Musculoskeletal: negative Remaining ROS negative.  Physical Exam:  Blood pressure 111/66, pulse 63, temperature 98.2 F (36.8 C), temperature source Oral, resp. rate 20, height 5\' 4"  (1.626 m), weight 121 lb 8 oz (55.112 kg).  ECOG:    HEENT:  Sclerae anicteric, conjunctivae pink.  Oropharynx clear.  No mucositis or candidiasis.  Nodes:  No cervical, supraclavicular, or axillary lymphadenopathy palpated.  Breast Exam:  Right breast   has a mass present adjacent to the nipple areolar complex measuring about 3 cm..  Left breast is benign.  No masses, discharge, skin change, or nipple inversion..  Lungs:  Clear to auscultation bilaterally.  No crackles, rhonchi, or wheezes.  Heart:  Regular rate and rhythm.  Abdomen:  Soft, nontender.  Positive bowel sounds.  No organomegaly or masses palpated.  Musculoskeletal:  No focal spinal tenderness to palpation.  Extremities:  Benign.  No peripheral edema or cyanosis.  Skin:   Benign.  Neuro:  Nonfocal.    Lab Results: Lab Results  Component Value Date   WBC 5.5 09/26/2011   HGB 15.2 09/26/2011   HCT 43.7 09/26/2011   MCV 90.9 09/26/2011   PLT 225 09/26/2011     Chemistry      Component Value Date/Time   NA 139 09/26/2011 0822   K 3.6 09/26/2011 0822   CL 101 09/26/2011 0822   CO2 29 09/26/2011 0822   BUN 11 09/26/2011 0822   CREATININE 0.75 09/26/2011 0822      Component Value Date/Time   CALCIUM 9.6 09/26/2011 0822   ALKPHOS 73 09/26/2011 0822   AST 23 09/26/2011 0822   ALT 23 09/26/2011 0822   BILITOT 0.5 09/26/2011 4098       Radiological Studies:  US Breast Right  09/11/2011  *RADIOLOGY REPORT*  Clinical Data:  Palpable lump in the upper right breast.  Annual reevaluation, left breast.  DIGITAL DIAGNOSTIC BILATERAL MAMMOGRAM WITH CAD AND RIGHT BREAST ULTRASOUND:  Comparison:  04/23/2000.  No interval mammography.  Findings:  Standard and implant displaced CC and MLO views of both breasts and a spot tangential view of the right breast in the area of palpable concern were obtained.  In the area of palpable concern there is an approximate 2 cm mass with poorly circumscribed margins, new since the prior examination.  No new mass, nonsurgical architectural distortion, or suspicious calcifications were identified elsewhere in either breast.  No evidence of implant rupture. Mammographic images were processed with CAD.  On physical exam, I palpate a firm mass in the 12 o'clock position of the right breast, approximately 5 cm from the nipple.  Ultrasound is performed, showing a vague heterogeneous  solid mass with poorly visible margins at the 12 o'clock position of the right breast, 5 cm from the nipple, sitting upon the right pectoralis major muscle.  There is no associated acoustic shadowing.  The mass measures approximately 1.9 x 1.0 x 1.7 cm.  IMPRESSION:  1.  Indeterminate mass at the 12 o'clock position of the right breast, 5 cm from the nipple, corresponding to the  palpable abnormality. 2.  No mammographic evidence of malignancy, left breast.  RECOMMENDATION: Ultrasound-guided core needle biopsy of the right breast mass versus short-term interval follow-up was discussed with the patient and her husband.  The patient has elected to proceed with biopsy which has been scheduled for Thursday, August 8, at 10 o'clock a.m.  BI-RADS CATEGORY 4:  Suspicious abnormality - biopsy should be considered.  Results were discussed by telephone with Dr. Tresa Res on 09/11/2011 at 1205 hours.  Original Report Authenticated By: Arnell Sieving, M.D.   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  Presurgical evaluation.  BILATERAL BREAST MRI WITH AND WITHOUT CONTRAST  Technique: Multiplanar, multisequence MR images of both breasts were obtained prior to and following the intravenous administration of 11ml of Multihance.  Three dimensional images were evaluated at the independent DynaCad workstation.  Comparison:  Mammograms dated 09/20/2011 and 09/11/2011.  Findings: There is mild to moderate diffuse bilateral parenchymal background enhancement.  There are bilateral submuscular silicone breast implants present.  There is intracapsular rupture of the left breast implant noted.  There is an irregular enhancing mass located within the right breast at the 12 o'clock position with central clip artifact corresponding to the recently diagnosed invasive ductal carcinoma with DCIS.  This measures 2.4 x 2.2 x 1.4 cm in size and is associated with plateau enhancement kinetics.  The mass is located 6 mm anterior to the breast implant.  There are no additional worrisome enhancing foci within either breast and there is no evidence for axillary or internal mammary adenopathy.  There are no additional findings.  IMPRESSION:  1.  2.4 cm enhancing mass located within the right breast at the 12 o'clock  position with central clip artifact corresponding to the recently diagnosed invasive ductal carcinoma with DCIS.  No evidence for axillary or internal mammary adenopathy.  2.  Bilateral subpectoral silicone breast implants with findings consistent with intracapsular rupture of the left breast implant.  RECOMMENDATION: Treatment plan  THREE-DIMENSIONAL MR IMAGE RENDERING ON INDEPENDENT WORKSTATION:  Three-dimensional MR images were rendered by post-processing of the original MR data on an independent workstation.  The three- dimensional MR images were interpreted, and findings were reported in the accompanying complete MRI report for this study.  BI-RADS CATEGORY 6:  Known biopsy-proven malignancy - appropriate action should be taken.  Original Report Authenticated By: Rolla Plate, M.D.   [place] Core Biopsy  09/20/2011  *RADIOLOGY REPORT*  Clinical Data:  Palpable right breast mass.  ULTRASOUND GUIDED CORE BIOPSY OF THE right BREAST  Comparison: Previous exams.  I met with the patient and we discussed the procedure of ultrasound- guided biopsy, including benefits and alternatives.  We discussed the high likelihood of a successful procedure. We discussed the risks of the procedure, including infection, bleeding, tissue injury, clip migration, and inadequate sampling.  Informed written consent was given.  Using sterile technique 2% lidocaine, ultrasound guidance and a 14 gauge automated biopsy device, biopsy was performed of the mass using a lateral approach.  At the conclusion of the procedure a ribbon tissue marker clip was deployed into the biopsy cavity. Follow up 2 view mammogram was performed and dictated separately.  IMPRESSION: Ultrasound guided biopsy of a right breast mass.  No apparent complications.  Original Report Authenticated By: Hiram Gash, M.D.   Mm Digital Diagnostic Bilat  09/11/2011  *RADIOLOGY REPORT*  Clinical Data:  Palpable lump in the upper right breast.  Annual reevaluation,  left breast.  DIGITAL DIAGNOSTIC BILATERAL MAMMOGRAM WITH CAD AND RIGHT BREAST ULTRASOUND:  Comparison:  04/23/2000.  No interval mammography.  Findings:  Standard and implant displaced CC and MLO views of both breasts and a spot tangential view of the right breast in the area of palpable concern were obtained.  In the area of palpable concern there is an approximate 2 cm mass with poorly circumscribed margins, new since the prior examination.  No new mass, nonsurgical architectural distortion, or suspicious calcifications were identified elsewhere in either breast.  No evidence of implant rupture. Mammographic images were processed with CAD.  On physical exam, I palpate a firm mass in the 12 o'clock position of the right breast, approximately 5 cm from the nipple.  Ultrasound is performed, showing a vague heterogeneous solid mass with poorly visible margins at the 12 o'clock position of the right breast, 5 cm from the nipple, sitting upon the right pectoralis major muscle.  There is no associated acoustic shadowing.  The mass measures approximately 1.9 x 1.0 x 1.7 cm.  IMPRESSION:  1.  Indeterminate mass at the 12 o'clock position of the right breast, 5 cm from the nipple, corresponding to the palpable abnormality. 2.  No mammographic evidence of malignancy, left breast.  RECOMMENDATION: Ultrasound-guided core needle biopsy of the right breast mass versus short-term interval follow-up was discussed with the patient and her husband.  The patient has elected to proceed with biopsy which has been scheduled for Thursday, August 8, at 10 o'clock a.m.  BI-RADS CATEGORY 4:  Suspicious abnormality - biopsy should be considered.  Results were discussed by telephone with Dr. Tresa Res on 09/11/2011 at 1205 hours.  Original Report Authenticated By: Arnell Sieving, M.D.   Mm Digital Diagnostic Unilat R  09/20/2011  *RADIOLOGY REPORT*  Clinical Data:  Right breast mass  DIGITAL DIAGNOSTIC RIGHT MAMMOGRAM  Comparison:   Previous exams.  Findings:  Films are performed following ultrasound guided biopsy of right breast mass.  Images show a ribbon type clip in association with a mass in the upper central aspect of the right breast.  IMPRESSION: Adequate clip placement following ultrasound guided right breast biopsy.  Original Report Authenticated By: Hiram Gash, M.D.   Mm Radiologist Eval And Mgmt  09/24/2011  **ADDENDUM** CREATED: 09/24/2011 13:59:05  Final pathology demonstrates both INVASIVE AND IN SITU DUCTAL CARCINOMA. Histology correlates with imaging findings.  These results were given to the patient by phone which she understood. Her questions were answered. The patient had no complaints with her biopsy site.  Recommend surgery - oncology consultation.  The patient is deciding whether she will attend the [facility] on 09/26/11 or a scheduled appointment with Dr. Jamey Ripa on 10/17/2011.  **END ADDENDUM** SIGNED BY: Tinnie Gens T. Si Gaul, M.D.   09/21/2011  *RADIOLOGY REPORT*  ESTABLISHED PATIENT OFFICE VISIT - LEVEL II [phone])  Chief Complaint:  Right breast mass.  History:  Right breast mass which the patient underwent an ultrasound guided right breast biopsy.  Exam:  The biopsy site is clean and dry with an area  of adjacent ecchymosis measuring less than 1 cm.  No erythema is present.  The the patient reports no tenderness.  Pathology: Pathology reveals at least invasive ductal carcinoma in situ, concordant with imaging findings.  The deeper levels are being done to evaluate for invasive disease.  Assessment and Plan:  A breast MRI is scheduled for 09/25/2011 at 8:45 a.m.  The patient will be seen at [facility] on 09/26/2011.  Original Report Authenticated By: Rosendo Gros, M.D.     IMPRESSIONS AND PLAN: A 56 y.o. female with   History of locally advanced ER/PR positive breast cancer. Discusses with her. She will have Oncotype testing performed. I also discussed going on with an aromatase  inhibitor approach. She has stopped taking her hormone replacement feels fairly well without significant hot flashes. I recommended letrozole. I plan to see her back in 6 weeks' time. In addition we will likely get her mammogram or ultrasound after 3 months time. I gave her information about letrozole side effects mechanisms of action . She prefers not to take generics.  Spent more than half the time coordinating care, as well as discussion of BMI and its implications.      Cyndy Braver 8/23/20132:09 PM Cell [phone]

## 2011-10-05 NOTE — Patient Instructions (Signed)
WE WILL START LETROZOLE TO HELP SHRINK THE CANCER;  WE WILL GET A BASELINE BONE DENSITY TEST AND CT SCAN WE WILL SEE YOU IN 6 WEEKS TO ASSESS RESPONSE

## 2011-10-08 ENCOUNTER — Other Ambulatory Visit: Payer: Self-pay | Admitting: Emergency Medicine

## 2011-10-08 ENCOUNTER — Encounter: Payer: Self-pay | Admitting: Internal Medicine

## 2011-10-08 ENCOUNTER — Encounter: Payer: Self-pay | Admitting: Oncology

## 2011-10-08 NOTE — Progress Notes (Unsigned)
[employer] @ [phone] approved letrozole 2.5mg  from 10/08/11-10/08/14.

## 2011-10-12 ENCOUNTER — Encounter: Payer: Self-pay | Admitting: *Deleted

## 2011-10-12 NOTE — Progress Notes (Signed)
Received Oncotype Dx results of 6.  Gave copy to MD. Took copy to Med Rec to scan. 

## 2011-10-16 ENCOUNTER — Ambulatory Visit (HOSPITAL_COMMUNITY)
Admission: RE | Admit: 2011-10-16 | Discharge: 2011-10-16 | Disposition: A | Discharge status: Home / Self Care | Payer: PRIVATE HEALTH INSURANCE | Source: Ambulatory Visit | Attending: Oncology | Admitting: Oncology

## 2011-10-16 ENCOUNTER — Ambulatory Visit
Admission: RE | Admit: 2011-10-16 | Discharge: 2011-10-16 | Disposition: A | Discharge status: Home / Self Care | Payer: PRIVATE HEALTH INSURANCE | Source: Ambulatory Visit | Attending: Oncology | Admitting: Oncology

## 2011-10-16 DIAGNOSIS — C50419 Malignant neoplasm of upper-outer quadrant of unspecified female breast: Secondary | ICD-10-CM

## 2011-10-16 DIAGNOSIS — Y831 Surgical operation with implant of artificial internal device as the cause of abnormal reaction of the patient, or of later complication, without mention of misadventure at the time of the procedure: Secondary | ICD-10-CM | POA: Insufficient documentation

## 2011-10-16 DIAGNOSIS — T8549XA Other mechanical complication of breast prosthesis and implant, initial encounter: Secondary | ICD-10-CM | POA: Insufficient documentation

## 2011-10-16 MED ORDER — IOHEXOL 300 MG/ML  SOLN
80.0000 mL | Freq: Once | INTRAMUSCULAR | Status: AC | PRN
  Administered 2011-10-16: 80 mL via INTRAVENOUS

## 2011-10-17 ENCOUNTER — Encounter (INDEPENDENT_AMBULATORY_CARE_PROVIDER_SITE_OTHER): Payer: Self-pay | Admitting: Surgery

## 2011-11-09 ENCOUNTER — Telehealth: Payer: Self-pay | Admitting: *Deleted

## 2011-11-09 NOTE — Telephone Encounter (Signed)
Pt left vm that her "lump has shrunk so much that I cant feel it anymore" pt is very excited about this and wants MD to be aware.

## 2011-11-15 ENCOUNTER — Other Ambulatory Visit: Payer: PRIVATE HEALTH INSURANCE | Admitting: Lab

## 2011-11-15 ENCOUNTER — Ambulatory Visit: Payer: PRIVATE HEALTH INSURANCE | Admitting: Oncology

## 2011-12-04 ENCOUNTER — Other Ambulatory Visit: Payer: Self-pay | Admitting: *Deleted

## 2011-12-04 DIAGNOSIS — C50419 Malignant neoplasm of upper-outer quadrant of unspecified female breast: Secondary | ICD-10-CM

## 2011-12-05 ENCOUNTER — Ambulatory Visit (HOSPITAL_BASED_OUTPATIENT_CLINIC_OR_DEPARTMENT_OTHER): Payer: PRIVATE HEALTH INSURANCE | Admitting: Oncology

## 2011-12-05 ENCOUNTER — Other Ambulatory Visit (HOSPITAL_BASED_OUTPATIENT_CLINIC_OR_DEPARTMENT_OTHER): Payer: PRIVATE HEALTH INSURANCE | Admitting: Lab

## 2011-12-05 VITALS — BP 117/81 | HR 73 | Temp 98.3°F | Resp 20 | Ht 64.0 in | Wt 120.9 lb

## 2011-12-05 DIAGNOSIS — C50419 Malignant neoplasm of upper-outer quadrant of unspecified female breast: Secondary | ICD-10-CM

## 2011-12-05 DIAGNOSIS — Z170 Estrogen receptor positive status [ER+]: Secondary | ICD-10-CM

## 2011-12-05 DIAGNOSIS — C50919 Malignant neoplasm of unspecified site of unspecified female breast: Secondary | ICD-10-CM

## 2011-12-05 LAB — COMPREHENSIVE METABOLIC PANEL (CC13)
AST: 28 U/L (ref 5–34)
Albumin: 3.8 g/dL (ref 3.5–5.0)
Alkaline Phosphatase: 72 U/L (ref 40–150)
Glucose: 112 mg/dl — ABNORMAL HIGH (ref 70–99)
Potassium: 3.8 mEq/L (ref 3.5–5.1)
Sodium: 139 mEq/L (ref 136–145)
Total Bilirubin: 0.4 mg/dL (ref 0.20–1.20)
Total Protein: 6.4 g/dL (ref 6.4–8.3)

## 2011-12-05 LAB — CBC WITH DIFFERENTIAL/PLATELET
EOS%: 3.8 % (ref 0.0–7.0)
Eosinophils Absolute: 0.3 10*3/uL (ref 0.0–0.5)
LYMPH%: 30.4 % (ref 14.0–49.7)
MCH: 31.6 pg (ref 25.1–34.0)
MCHC: 34.3 g/dL (ref 31.5–36.0)
MCV: 92 fL (ref 79.5–101.0)
MONO%: 6.3 % (ref 0.0–14.0)
NEUT#: 4.4 10*3/uL (ref 1.5–6.5)
Platelets: 213 10*3/uL (ref 145–400)
RBC: 4.54 10*6/uL (ref 3.70–5.45)
RDW: 12.7 % (ref 11.2–14.5)

## 2011-12-05 LAB — CANCER ANTIGEN 27.29: CA 27.29: 22 U/mL (ref 0–39)

## 2011-12-05 NOTE — Progress Notes (Signed)
Hematology and Oncology Follow Up Visit  Andrea Garrett [id number] [birth year] 56 y.o. 12/05/2011 4:43 PM   DIAGNOSIS:   Encounter Diagnosis  Name Primary?  . 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 She elected not to enroll in study. She has had an Oncotype test that regardless.  Medications: I have reviewed the patient's current medications.  Allergies: No Known Allergies  Past Medical History, Surgical history, Social history, and Family History were reviewed and updated.  Review of Systems: Constitutional:  Negative for fever, chills, night sweats, anorexia, weight loss, pain. Cardiovascular: no chest pain or dyspnea on exertion Respiratory: negative Neurological: negative Dermatological: negative ENT: negative Skin Gastrointestinal: negative Genito-Urinary: negative Hematological and Lymphatic: negative Breast: negative Musculoskeletal: negative Remaining ROS negative.  Physical Exam:  Blood pressure 117/81, pulse 73, temperature 98.3 F (36.8 C), temperature source Oral, resp. rate 20, height 5\' 4"  (1.626 m), weight 120 lb 14.4 oz (54.84 kg).  ECOG:    HEENT:  Sclerae anicteric, conjunctivae pink.  Oropharynx clear.  No mucositis or candidiasis.  Nodes:  No cervical, supraclavicular, or axillary lymphadenopathy palpated.  Breast Exam:  Right breast   has a mass present adjacent to the nipple areolar complex measuring about 3 cm..  Left breast is benign.  No masses, discharge, skin change, or nipple inversion..  Lungs:  Clear to auscultation bilaterally.  No crackles, rhonchi, or wheezes.  Heart:  Regular rate and rhythm.  Abdomen:  Soft, nontender.  Positive bowel sounds.  No organomegaly or masses palpated.  Musculoskeletal:  No focal spinal tenderness to palpation.  Extremities:  Benign.  No peripheral edema or cyanosis.   Skin:  Benign.  Neuro:  Nonfocal.    Lab Results: Lab Results  Component Value Date   WBC 7.5 12/05/2011   HGB 14.3 12/05/2011   HCT 41.8 12/05/2011   MCV 92.0 12/05/2011   PLT 213 12/05/2011     Chemistry      Component Value Date/Time   NA 139 12/05/2011 1522   NA 139 09/26/2011 0822   K 3.8 12/05/2011 1522   K 3.6 09/26/2011 0822   CL 104 12/05/2011 1522   CL 101 09/26/2011 0822   CO2 24 12/05/2011 1522   CO2 29 09/26/2011 0822   BUN 13.0 12/05/2011 1522   BUN 11 09/26/2011 0822   CREATININE 0.8 12/05/2011 1522   CREATININE 0.75 09/26/2011 0822      Component Value Date/Time   CALCIUM 9.5 12/05/2011 1522   CALCIUM 9.6 09/26/2011 0822   ALKPHOS 72 12/05/2011 1522   ALKPHOS 73 09/26/2011 0822   AST 28 12/05/2011 1522   AST 23 09/26/2011 0822   ALT 29 12/05/2011 1522   ALT 23 09/26/2011 0822   BILITOT 0.40 12/05/2011 1522   BILITOT 0.5 09/26/2011 4098       Radiological Studies:  US Breast Right  09/11/2011  *RADIOLOGY REPORT*  Clinical Data:  Palpable lump in the upper right breast.  Annual reevaluation, left breast.  DIGITAL DIAGNOSTIC BILATERAL MAMMOGRAM WITH CAD AND RIGHT BREAST ULTRASOUND:  Comparison:  04/23/2000.  No interval mammography.  Findings:  Standard and implant displaced CC and MLO views of both breasts and a spot tangential view of the right breast in the area of palpable concern were obtained.  In the area of palpable concern there is an approximate 2 cm mass with poorly circumscribed  margins, new since the prior examination.  No new mass, nonsurgical architectural distortion, or suspicious calcifications were identified elsewhere in either breast.  No evidence of implant rupture. Mammographic images were processed with CAD.  On physical exam, I palpate a firm mass in the 12 o'clock position of the right breast, approximately 5 cm from the nipple.  Ultrasound is performed, showing a vague heterogeneous solid mass with poorly visible margins at the 12 o'clock  position of the right breast, 5 cm from the nipple, sitting upon the right pectoralis major muscle.  There is no associated acoustic shadowing.  The mass measures approximately 1.9 x 1.0 x 1.7 cm.  IMPRESSION:  1.  Indeterminate mass at the 12 o'clock position of the right breast, 5 cm from the nipple, corresponding to the palpable abnormality. 2.  No mammographic evidence of malignancy, left breast.  RECOMMENDATION: Ultrasound-guided core needle biopsy of the right breast mass versus short-term interval follow-up was discussed with the patient and her husband.  The patient has elected to proceed with biopsy which has been scheduled for Thursday, August 8, at 10 o'clock a.m.  BI-RADS CATEGORY 4:  Suspicious abnormality - biopsy should be considered.  Results were discussed by telephone with Dr. Tresa Res on 09/11/2011 at 1205 hours.  Original Report Authenticated By: Arnell Sieving, M.D.   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  Presurgical evaluation.  BILATERAL BREAST MRI WITH AND WITHOUT CONTRAST  Technique: Multiplanar, multisequence MR images of both breasts were obtained prior to and following the intravenous administration of 11ml of Multihance.  Three dimensional images were evaluated at the independent DynaCad workstation.  Comparison:  Mammograms dated 09/20/2011 and 09/11/2011.  Findings: There is mild to moderate diffuse bilateral parenchymal background enhancement.  There are bilateral submuscular silicone breast implants present.  There is intracapsular rupture of the left breast implant noted.  There is an irregular enhancing mass located within the right breast at the 12 o'clock position with central clip artifact corresponding to the recently diagnosed invasive ductal carcinoma with DCIS.  This measures 2.4 x 2.2 x 1.4 cm in size and is associated with plateau enhancement kinetics.   The mass is located 6 mm anterior to the breast implant.  There are no additional worrisome enhancing foci within either breast and there is no evidence for axillary or internal mammary adenopathy.  There are no additional findings.  IMPRESSION:  1.  2.4 cm enhancing mass located within the right breast at the 12 o'clock position with central clip artifact corresponding to the recently diagnosed invasive ductal carcinoma with DCIS.  No evidence for axillary or internal mammary adenopathy.  2.  Bilateral subpectoral silicone breast implants with findings consistent with intracapsular rupture of the left breast implant.  RECOMMENDATION: Treatment plan  THREE-DIMENSIONAL MR IMAGE RENDERING ON INDEPENDENT WORKSTATION:  Three-dimensional MR images were rendered by post-processing of the original MR data on an independent workstation.  The three- dimensional MR images were interpreted, and findings were reported in the accompanying complete MRI report for this study.  BI-RADS CATEGORY 6:  Known biopsy-proven malignancy - appropriate action should be taken.  Original Report Authenticated By: Rolla Plate, M.D.   [place] Core Biopsy  09/20/2011  *RADIOLOGY REPORT*  Clinical Data:  Palpable right breast mass.  ULTRASOUND GUIDED CORE BIOPSY OF THE right BREAST  Comparison: Previous exams.  I met with the patient and we discussed the procedure  of ultrasound- guided biopsy, including benefits and alternatives.  We discussed the high likelihood of a successful procedure. We discussed the risks of the procedure, including infection, bleeding, tissue injury, clip migration, and inadequate sampling.  Informed written consent was given.  Using sterile technique 2% lidocaine, ultrasound guidance and a 14 gauge automated biopsy device, biopsy was performed of the mass using a lateral approach.  At the conclusion of the procedure a ribbon tissue marker clip was deployed into the biopsy cavity. Follow up 2 view mammogram was performed  and dictated separately.  IMPRESSION: Ultrasound guided biopsy of a right breast mass.  No apparent complications.  Original Report Authenticated By: Hiram Gash, M.D.   Mm Digital Diagnostic Bilat  09/11/2011  *RADIOLOGY REPORT*  Clinical Data:  Palpable lump in the upper right breast.  Annual reevaluation, left breast.  DIGITAL DIAGNOSTIC BILATERAL MAMMOGRAM WITH CAD AND RIGHT BREAST ULTRASOUND:  Comparison:  04/23/2000.  No interval mammography.  Findings:  Standard and implant displaced CC and MLO views of both breasts and a spot tangential view of the right breast in the area of palpable concern were obtained.  In the area of palpable concern there is an approximate 2 cm mass with poorly circumscribed margins, new since the prior examination.  No new mass, nonsurgical architectural distortion, or suspicious calcifications were identified elsewhere in either breast.  No evidence of implant rupture. Mammographic images were processed with CAD.  On physical exam, I palpate a firm mass in the 12 o'clock position of the right breast, approximately 5 cm from the nipple.  Ultrasound is performed, showing a vague heterogeneous solid mass with poorly visible margins at the 12 o'clock position of the right breast, 5 cm from the nipple, sitting upon the right pectoralis major muscle.  There is no associated acoustic shadowing.  The mass measures approximately 1.9 x 1.0 x 1.7 cm.  IMPRESSION:  1.  Indeterminate mass at the 12 o'clock position of the right breast, 5 cm from the nipple, corresponding to the palpable abnormality. 2.  No mammographic evidence of malignancy, left breast.  RECOMMENDATION: Ultrasound-guided core needle biopsy of the right breast mass versus short-term interval follow-up was discussed with the patient and her husband.  The patient has elected to proceed with biopsy which has been scheduled for Thursday, August 8, at 10 o'clock a.m.  BI-RADS CATEGORY 4:  Suspicious abnormality - biopsy  should be considered.  Results were discussed by telephone with Dr. Tresa Res on 09/11/2011 at 1205 hours.  Original Report Authenticated By: Arnell Sieving, M.D.   Mm Digital Diagnostic Unilat R  09/20/2011  *RADIOLOGY REPORT*  Clinical Data:  Right breast mass  DIGITAL DIAGNOSTIC RIGHT MAMMOGRAM  Comparison:  Previous exams.  Findings:  Films are performed following ultrasound guided biopsy of right breast mass.  Images show a ribbon type clip in association with a mass in the upper central aspect of the right breast.  IMPRESSION: Adequate clip placement following ultrasound guided right breast biopsy.  Original Report Authenticated By: Hiram Gash, M.D.   Mm Radiologist Eval And Mgmt  09/24/2011  **ADDENDUM** CREATED: 09/24/2011 13:59:05  Final pathology demonstrates both INVASIVE AND IN SITU DUCTAL CARCINOMA. Histology correlates with imaging findings.  These results were given to the patient by phone which she understood. Her questions were answered. The patient had no complaints with her biopsy site.  Recommend surgery - oncology consultation.  The patient is deciding whether she will attend the [facility] on 09/26/11 or a  scheduled appointment with Dr. Jamey Ripa on 10/17/2011.  **END ADDENDUM** SIGNED BY: Tinnie Gens T. Si Gaul, M.D.   09/21/2011  *RADIOLOGY REPORT*  ESTABLISHED PATIENT OFFICE VISIT - LEVEL II [phone])  Chief Complaint:  Right breast mass.  History:  Right breast mass which the patient underwent an ultrasound guided right breast biopsy.  Exam:  The biopsy site is clean and dry with an area of adjacent ecchymosis measuring less than 1 cm.  No erythema is present.  The the patient reports no tenderness.  Pathology: Pathology reveals at least invasive ductal carcinoma in situ, concordant with imaging findings.  The deeper levels are being done to evaluate for invasive disease.  Assessment and Plan:  A breast MRI is scheduled for 09/25/2011 at 8:45 a.m.  The patient will be seen at  [facility] on 09/26/2011.  Original Report Authenticated By: Rosendo Gros, M.D.     IMPRESSIONS AND PLAN: A 56 y.o. female with   History of locally advanced ER/PR positive breast cancer. Discusses with her. She will have Oncotype testing performed. I also discussed going on with an aromatase inhibitor approach. She has stopped taking her hormone replacement feels fairly well without significant hot flashes. I recommended letrozole. I plan to see her back in 6 weeks' time. In addition we will likely get her mammogram or ultrasound after 3 months time. I gave her information about letrozole side effects mechanisms of action . She prefers not to take generics.  Spent more than half the time coordinating care, as well as discussion of BMI and its implications.      Zacheriah Stumpe 10/23/20134:43 PM Cell [id number]

## 2011-12-06 ENCOUNTER — Telehealth: Payer: Self-pay | Admitting: *Deleted

## 2011-12-06 NOTE — Telephone Encounter (Signed)
PATIENT CONFIRMED OVER THE PHONE THE NEW DATE AND TIME FOR THE FOLLOWING APPOINTMENTS  MAMMOGRAM AND ULTRASOUND AT THE [facility] ON 12-27-2011 ARRIVAL TIME 3:00PM  DR.HOXWORTH ON 01-03-2012 ARRIVAL TIME 9:45AM  DR.RUBIN LAB STARTING AT 3:00PM

## 2011-12-27 ENCOUNTER — Ambulatory Visit
Admission: RE | Admit: 2011-12-27 | Discharge: 2011-12-27 | Disposition: A | Discharge status: Home / Self Care | Payer: PRIVATE HEALTH INSURANCE | Source: Ambulatory Visit | Attending: Oncology | Admitting: Oncology

## 2011-12-27 DIAGNOSIS — C50419 Malignant neoplasm of upper-outer quadrant of unspecified female breast: Secondary | ICD-10-CM

## 2012-01-03 ENCOUNTER — Encounter (INDEPENDENT_AMBULATORY_CARE_PROVIDER_SITE_OTHER): Payer: Self-pay | Admitting: General Surgery

## 2012-01-03 ENCOUNTER — Ambulatory Visit (INDEPENDENT_AMBULATORY_CARE_PROVIDER_SITE_OTHER): Payer: PRIVATE HEALTH INSURANCE | Admitting: General Surgery

## 2012-01-03 VITALS — BP 106/70 | HR 70 | Temp 98.0°F | Resp 16 | Ht 65.0 in | Wt 122.2 lb

## 2012-01-03 DIAGNOSIS — C50419 Malignant neoplasm of upper-outer quadrant of unspecified female breast: Secondary | ICD-10-CM

## 2012-01-03 NOTE — Progress Notes (Signed)
Chief complaint: Followup right breast cancer  History:patient returns for followup of her 2 cm ER positive invasive duct carcinoma of the right breast. This was diagnosed in August. She was on hormonal replacement at that time. She has been on neoadjuvant hormonal treatment with cessation of her estrogen replacement and she is on letrozole. She recently had a followup mammogram and ultrasound of her right breast and this has remarkably shown no imaging evidence of residual cancer. She also reports that she is no longer able to feel a lump which was clearly palpable prior to her treatment.  Exam: BP 106/70  Pulse 70  Temp 98 F (36.7 C) (Temporal)  Resp 16  Ht 5\' 5"  (1.651 m)  Wt 122 lb 3.2 oz (55.43 kg)  BMI 20.34 kg/m2 General: Well-appearing Caucasian female Lymph nodes: No cervical, supraclavicular, or axillary nodes palpable Breasts: Right breast is now normal to exam with no palpable abnormality in the 12:00 position.  Assessment and plan: ER/PR positive invasive carcinoma the right breast with complete clinical remission after just 3 months of hormonal treatment. I discussed standard surgical therapy with the patient and her husband today which would include needle localized lumpectomy and sentinel lymph node biopsy. She had understandable questions of why surgery would be required with negative x-rays and exam.  We discussed that she may have had a complete response but that imaging is not entirely conclusive and that standard therapy would include surgery. She will discuss this with Dr. Donnie Coffin and if he and she are in agreement we could go ahead and schedule her for lumpectomy and sentinel lymph node biopsy.

## 2012-01-03 NOTE — Patient Instructions (Addendum)
I have recommended proceeding with needle localized lumpectomy and sentinel lymph node biopsy.  Discuss this with Dr. Donnie Coffin and then we will make further plans.

## 2012-01-04 ENCOUNTER — Telehealth: Payer: Self-pay | Admitting: *Deleted

## 2012-01-04 NOTE — Telephone Encounter (Signed)
Left voice message to inform the patient of the new date and time on 02-20-2012 starting at 10:00am

## 2012-01-25 ENCOUNTER — Ambulatory Visit: Payer: PRIVATE HEALTH INSURANCE | Admitting: Oncology

## 2012-01-25 ENCOUNTER — Other Ambulatory Visit: Payer: PRIVATE HEALTH INSURANCE | Admitting: Lab

## 2012-02-05 ENCOUNTER — Telehealth: Payer: Self-pay | Admitting: *Deleted

## 2012-02-05 NOTE — Telephone Encounter (Signed)
left voice message to inform the patient of the md will be changing in 2014 

## 2012-02-14 ENCOUNTER — Other Ambulatory Visit: Payer: Self-pay | Admitting: *Deleted

## 2012-02-14 ENCOUNTER — Other Ambulatory Visit (INDEPENDENT_AMBULATORY_CARE_PROVIDER_SITE_OTHER): Payer: Self-pay | Admitting: General Surgery

## 2012-02-14 ENCOUNTER — Telehealth: Payer: Self-pay | Admitting: *Deleted

## 2012-02-14 DIAGNOSIS — C50419 Malignant neoplasm of upper-outer quadrant of unspecified female breast: Secondary | ICD-10-CM

## 2012-02-14 DIAGNOSIS — C50919 Malignant neoplasm of unspecified site of unspecified female breast: Secondary | ICD-10-CM

## 2012-02-14 NOTE — Telephone Encounter (Signed)
Pt called to r/s appt with Dr. Donnie Coffin.  Informed pt that Dr. Donnie Coffin is no longer with the practice.  Pt request to see Dr. Darnelle Catalan.  Appt made for labs on 03/05/12 at 1:00pm and to see Dr. Darnelle Catalan on 03/12/12 at 9:00am.  Pt confirmed new appt dates and time.  Pt denies further needs or concerns at this time.  Encourage pt to call with questions.  Contact information given.

## 2012-02-15 ENCOUNTER — Telehealth (INDEPENDENT_AMBULATORY_CARE_PROVIDER_SITE_OTHER): Payer: Self-pay

## 2012-02-15 NOTE — Telephone Encounter (Signed)
Pt called wanting to know if she needs to see her new oncologist she has been assigned prior to her surgery. Since Dr Donnie Coffin is no longer available pt will be seeing Dr Darnelle Catalan 03-12-12. Pt advised to call Dr Magrinat's office to see if he sees benefit in pre op appt with him. Pt states she had very good response to treatment. Pt will call to let us know if she is moving her onc appt.

## 2012-02-16 ENCOUNTER — Encounter: Payer: Self-pay | Admitting: Oncology

## 2012-02-19 ENCOUNTER — Telehealth: Payer: Self-pay | Admitting: Oncology

## 2012-02-19 NOTE — Telephone Encounter (Signed)
Pt called to review her care.   She has been taking an AI and is scheduled for BCT with a SLN 02/25/12.   She wanted to see if she truly needed surgery after having a complete clinical response to therapy.  I explained that at 57 years old, it is absolutely necessary and standard of care for surgery.

## 2012-02-20 ENCOUNTER — Other Ambulatory Visit: Payer: Self-pay | Admitting: Lab

## 2012-02-20 ENCOUNTER — Ambulatory Visit: Payer: Self-pay | Admitting: Oncology

## 2012-02-20 ENCOUNTER — Encounter (HOSPITAL_BASED_OUTPATIENT_CLINIC_OR_DEPARTMENT_OTHER): Payer: Self-pay | Admitting: *Deleted

## 2012-02-20 NOTE — Progress Notes (Signed)
No labs needed-she had labs 10/13 cancer center All preop teaching done-able to talk back info

## 2012-02-25 ENCOUNTER — Encounter (HOSPITAL_BASED_OUTPATIENT_CLINIC_OR_DEPARTMENT_OTHER): Payer: Self-pay | Admitting: Anesthesiology

## 2012-02-25 ENCOUNTER — Encounter (HOSPITAL_COMMUNITY)
Admission: RE | Admit: 2012-02-25 | Discharge: 2012-02-25 | Disposition: A | Discharge status: Home / Self Care | Payer: PRIVATE HEALTH INSURANCE | Source: Ambulatory Visit | Attending: General Surgery | Admitting: General Surgery

## 2012-02-25 ENCOUNTER — Ambulatory Visit (HOSPITAL_BASED_OUTPATIENT_CLINIC_OR_DEPARTMENT_OTHER): Payer: PRIVATE HEALTH INSURANCE | Admitting: Anesthesiology

## 2012-02-25 ENCOUNTER — Ambulatory Visit
Admission: RE | Admit: 2012-02-25 | Discharge: 2012-02-25 | Disposition: A | Discharge status: Home / Self Care | Payer: PRIVATE HEALTH INSURANCE | Source: Ambulatory Visit | Attending: General Surgery | Admitting: General Surgery

## 2012-02-25 ENCOUNTER — Encounter (HOSPITAL_BASED_OUTPATIENT_CLINIC_OR_DEPARTMENT_OTHER)
Admission: RE | Disposition: A | Discharge status: Home / Self Care | Payer: Self-pay | Source: Ambulatory Visit | Attending: General Surgery

## 2012-02-25 ENCOUNTER — Ambulatory Visit (HOSPITAL_BASED_OUTPATIENT_CLINIC_OR_DEPARTMENT_OTHER)
Admission: RE | Admit: 2012-02-25 | Discharge: 2012-02-25 | Disposition: A | Discharge status: Home / Self Care | Payer: PRIVATE HEALTH INSURANCE | Source: Ambulatory Visit | Attending: General Surgery | Admitting: General Surgery

## 2012-02-25 DIAGNOSIS — C50919 Malignant neoplasm of unspecified site of unspecified female breast: Secondary | ICD-10-CM

## 2012-02-25 DIAGNOSIS — C50419 Malignant neoplasm of upper-outer quadrant of unspecified female breast: Secondary | ICD-10-CM | POA: Insufficient documentation

## 2012-02-25 HISTORY — DX: Other specified health status: Z78.9

## 2012-02-25 HISTORY — PX: BREAST LUMPECTOMY WITH NEEDLE LOCALIZATION AND AXILLARY SENTINEL LYMPH NODE BX: SHX5760

## 2012-02-25 SURGERY — BREAST LUMPECTOMY WITH NEEDLE LOCALIZATION AND AXILLARY SENTINEL LYMPH NODE BX
Anesthesia: General | Site: Breast | Laterality: Right | Wound class: Clean

## 2012-02-25 MED ORDER — MIDAZOLAM HCL 2 MG/2ML IJ SOLN
1.0000 mg | INTRAMUSCULAR | Status: DC | PRN
Start: 2012-02-25 — End: 2012-02-25
  Administered 2012-02-25: 2 mg via INTRAVENOUS

## 2012-02-25 MED ORDER — PROPOFOL 10 MG/ML IV BOLUS
INTRAVENOUS | Status: DC | PRN
  Administered 2012-02-25: 150 mg via INTRAVENOUS

## 2012-02-25 MED ORDER — OXYCODONE HCL 5 MG/5ML PO SOLN: 5.0000 mg | Freq: Once | ORAL | Status: DC | PRN

## 2012-02-25 MED ORDER — ACETAMINOPHEN 10 MG/ML IV SOLN
1000.0000 mg | Freq: Once | INTRAVENOUS | Status: AC
  Administered 2012-02-25: 1000 mg via INTRAVENOUS

## 2012-02-25 MED ORDER — FENTANYL CITRATE 0.05 MG/ML IJ SOLN
INTRAMUSCULAR | Status: DC | PRN
  Administered 2012-02-25: 100 ug via INTRAVENOUS

## 2012-02-25 MED ORDER — SODIUM CHLORIDE 0.9 % IJ SOLN
INTRAMUSCULAR | Status: DC | PRN
  Administered 2012-02-25: 15:00:00 via INTRAMUSCULAR

## 2012-02-25 MED ORDER — DEXAMETHASONE SODIUM PHOSPHATE 4 MG/ML IJ SOLN
INTRAMUSCULAR | Status: DC | PRN
  Administered 2012-02-25: 10 mg via INTRAVENOUS

## 2012-02-25 MED ORDER — ONDANSETRON HCL 4 MG/2ML IJ SOLN
INTRAMUSCULAR | Status: DC | PRN
  Administered 2012-02-25: 4 mg via INTRAVENOUS

## 2012-02-25 MED ORDER — CHLORHEXIDINE GLUCONATE 4 % EX LIQD: 1.0000 "application " | Freq: Once | CUTANEOUS | Status: DC

## 2012-02-25 MED ORDER — HYDROCODONE-ACETAMINOPHEN 5-325 MG PO TABS: 1.0000 | ORAL_TABLET | ORAL | Status: DC | PRN

## 2012-02-25 MED ORDER — EPHEDRINE SULFATE 50 MG/ML IJ SOLN
INTRAMUSCULAR | Status: DC | PRN
  Administered 2012-02-25: 10 mg via INTRAVENOUS

## 2012-02-25 MED ORDER — MIDAZOLAM HCL 2 MG/2ML IJ SOLN: 0.5000 mg | Freq: Once | INTRAMUSCULAR | Status: DC | PRN

## 2012-02-25 MED ORDER — BUPIVACAINE-EPINEPHRINE 0.25% -1:200000 IJ SOLN
INTRAMUSCULAR | Status: DC | PRN
  Administered 2012-02-25: 20 mL

## 2012-02-25 MED ORDER — FENTANYL CITRATE 0.05 MG/ML IJ SOLN
50.0000 ug | INTRAMUSCULAR | Status: DC | PRN
  Administered 2012-02-25: 50 ug via INTRAVENOUS
  Administered 2012-02-25: 100 ug via INTRAVENOUS
  Administered 2012-02-25 (×3): 25 ug via INTRAVENOUS

## 2012-02-25 MED ORDER — PROMETHAZINE HCL 25 MG/ML IJ SOLN: 6.2500 mg | INTRAMUSCULAR | Status: DC | PRN

## 2012-02-25 MED ORDER — MEPERIDINE HCL 25 MG/ML IJ SOLN: 6.2500 mg | INTRAMUSCULAR | Status: DC | PRN

## 2012-02-25 MED ORDER — TECHNETIUM TC 99M SULFUR COLLOID FILTERED
1.0000 | Freq: Once | INTRAVENOUS | Status: AC | PRN
  Administered 2012-02-25: 1 via INTRADERMAL

## 2012-02-25 MED ORDER — OXYCODONE HCL 5 MG PO TABS: 5.0000 mg | ORAL_TABLET | Freq: Once | ORAL | Status: DC | PRN

## 2012-02-25 MED ORDER — LACTATED RINGERS IV SOLN
INTRAVENOUS | Status: DC
  Administered 2012-02-25 (×2): via INTRAVENOUS

## 2012-02-25 MED ORDER — FENTANYL CITRATE 0.05 MG/ML IJ SOLN: 25.0000 ug | INTRAMUSCULAR | Status: DC | PRN

## 2012-02-25 MED ORDER — CEFAZOLIN SODIUM-DEXTROSE 2-3 GM-% IV SOLR
2.0000 g | INTRAVENOUS | Status: AC
  Administered 2012-02-25: 2 g via INTRAVENOUS

## 2012-02-25 SURGICAL SUPPLY — 59 items
APPLIER CLIP 11 MED OPEN (CLIP)
BINDER BREAST MEDIUM (GAUZE/BANDAGES/DRESSINGS) ×2 IMPLANT
BLADE SURG 10 STRL SS (BLADE) ×2 IMPLANT
BLADE SURG 15 STRL LF DISP TIS (BLADE) ×1 IMPLANT
BLADE SURG 15 STRL SS (BLADE) ×1
CANISTER SUCTION 1200CC (MISCELLANEOUS) ×2 IMPLANT
CHLORAPREP W/TINT 26ML (MISCELLANEOUS) ×2 IMPLANT
CLIP APPLIE 11 MED OPEN (CLIP) IMPLANT
CLIP TI WIDE RED SMALL 6 (CLIP) ×2 IMPLANT
CLOTH BEACON ORANGE TIMEOUT ST (SAFETY) ×2 IMPLANT
COVER MAYO STAND STRL (DRAPES) ×2 IMPLANT
COVER PROBE W GEL 5X96 (DRAPES) ×2 IMPLANT
COVER TABLE BACK 60X90 (DRAPES) ×2 IMPLANT
DECANTER SPIKE VIAL GLASS SM (MISCELLANEOUS) IMPLANT
DERMABOND ADVANCED (GAUZE/BANDAGES/DRESSINGS) ×2
DERMABOND ADVANCED .7 DNX12 (GAUZE/BANDAGES/DRESSINGS) ×2 IMPLANT
DEVICE DUBIN W/COMP PLATE 8390 (MISCELLANEOUS) ×2 IMPLANT
DRAIN CHANNEL 19F RND (DRAIN) IMPLANT
DRAIN HEMOVAC 1/8 X 5 (WOUND CARE) IMPLANT
DRAPE LAPAROSCOPIC ABDOMINAL (DRAPES) ×2 IMPLANT
DRAPE UTILITY XL STRL (DRAPES) ×2 IMPLANT
ELECT COATED BLADE 2.86 ST (ELECTRODE) ×2 IMPLANT
ELECT REM PT RETURN 9FT ADLT (ELECTROSURGICAL) ×2
ELECTRODE REM PT RTRN 9FT ADLT (ELECTROSURGICAL) ×1 IMPLANT
EVACUATOR SILICONE 100CC (DRAIN) IMPLANT
GLOVE BIOGEL PI IND STRL 7.0 (GLOVE) ×1 IMPLANT
GLOVE BIOGEL PI IND STRL 8 (GLOVE) ×1 IMPLANT
GLOVE BIOGEL PI INDICATOR 7.0 (GLOVE) ×1
GLOVE BIOGEL PI INDICATOR 8 (GLOVE) ×1
GLOVE ECLIPSE 7.0 STRL STRAW (GLOVE) ×2 IMPLANT
GLOVE SS BIOGEL STRL SZ 7.5 (GLOVE) ×2 IMPLANT
GLOVE SUPERSENSE BIOGEL SZ 7.5 (GLOVE) ×2
GOWN PREVENTION PLUS XLARGE (GOWN DISPOSABLE) ×2 IMPLANT
GOWN PREVENTION PLUS XXLARGE (GOWN DISPOSABLE) ×2 IMPLANT
KIT MARKER MARGIN INK (KITS) ×2 IMPLANT
NDL SAFETY ECLIPSE 18X1.5 (NEEDLE) ×1 IMPLANT
NEEDLE HYPO 18GX1.5 SHARP (NEEDLE) ×2
NEEDLE HYPO 25X1 1.5 SAFETY (NEEDLE) ×4 IMPLANT
NS IRRIG 1000ML POUR BTL (IV SOLUTION) ×2 IMPLANT
PACK BASIN DAY SURGERY FS (CUSTOM PROCEDURE TRAY) ×2 IMPLANT
PAD ALCOHOL SWAB (MISCELLANEOUS) ×2 IMPLANT
PENCIL BUTTON HOLSTER BLD 10FT (ELECTRODE) ×2 IMPLANT
PIN SAFETY STERILE (MISCELLANEOUS) IMPLANT
SLEEVE SCD COMPRESS KNEE MED (MISCELLANEOUS) ×2 IMPLANT
SPONGE LAP 18X18 X RAY DECT (DISPOSABLE) IMPLANT
SPONGE LAP 4X18 X RAY DECT (DISPOSABLE) ×2 IMPLANT
SUT ETHILON 3 0 FSL (SUTURE) IMPLANT
SUT MON AB 4-0 PC3 18 (SUTURE) IMPLANT
SUT MON AB 5-0 PS2 18 (SUTURE) ×2 IMPLANT
SUT SILK 3 0 SH 30 (SUTURE) IMPLANT
SUT VIC AB 3-0 SH 27 (SUTURE)
SUT VIC AB 3-0 SH 27X BRD (SUTURE) IMPLANT
SUT VICRYL 3-0 CR8 SH (SUTURE) ×2 IMPLANT
SYR CONTROL 10ML LL (SYRINGE) ×4 IMPLANT
TOWEL OR 17X24 6PK STRL BLUE (TOWEL DISPOSABLE) ×2 IMPLANT
TOWEL OR NON WOVEN STRL DISP B (DISPOSABLE) ×2 IMPLANT
TUBE CONNECTING 20X1/4 (TUBING) ×2 IMPLANT
WATER STERILE IRR 1000ML POUR (IV SOLUTION) IMPLANT
YANKAUER SUCT BULB TIP NO VENT (SUCTIONS) ×2 IMPLANT

## 2012-02-25 NOTE — Op Note (Signed)
Preoperative Diagnosis: cancer right breast  Postoprative Diagnosis: cancer right breast  Procedure: Procedure(s): BREAST LUMPECTOMY WITH NEEDLE LOCALIZATION AND AXILLARY SENTINEL LYMPH NODE BX   Surgeon: Glenna Fellows T   Assistants: None  Anesthesia:  General LMA anesthesiaDiagnos  Indications:   Patient is a 57 year old female in August of this year was diagnosed with approximately 2 cm ER positive invasive ductal carcinoma at the 12:00 position of the right breast. She has implants and fairly thin breast tissue. She was placed on neoadjuvant hormonal treatment and has had complete clinical and radiographic response. We have recommended proceeding with needle localized lumpectomy and axillary sentinel lymph node biopsy and surgical therapy. The indications for the procedure and risks have been discussed extensively detailed elsewhere.  Procedure Detail:  Following accurate needle localization, in the holding area under sedation 1 mCi of technetium sulfur colloid was injected intradermally around the right nipple. The patient was then brought to the operating room placed in the supine position and laryngeal mask general anesthesia induced. Patient timeout was performed and correct procedure verified after sterilely prepping the breast and 5 cc of dilute methylene blue was injected subcutaneous supinate the right nipple massaged for several minutes. Following this the entire breast, chest, axilla and upper arm were sterilely prepped and draped. A second patient timeout was performed and the procedure verified. The lumpectomy was approached initially. A curvilinear incision was made over the area of the tumor inferior to the wire insertion site and dissection carried down to the subcutaneous tissue. The dissection was widened and the wire brought into the incision. A specimen of breast tissue was excised around the shaft of the wire in an effort to obtain negative margins. This was carried  down onto the chest wall which was thinned pectoralis muscle and the capsule the implant was identified as well. It was not injured. The specimen was removed. The specimen mammogram showed the clip and the wire within the center of the specimen. The specimen had been oriented with ink and it was sent for permanent pathology. Following this the neoprobe was used to localize a hot area in the right axilla. A small transverse incision was made and dissection carried down to the subcutaneous tissue with cautery. The clavipectoral fascia was incised. Using the neoprobe for guidance with blunt dissection I came down on a slightly enlarged bright blue lymph node with a small blue lymph node adjacent to it. Both of these were excised with cautery with a single specimen. Ex vivo the specimen had counts of approximately 1500 with background in the axilla of less than 30. The specimen was sent for permanent pathology as hot blue right axillary sentinel lymph node containing 2 nodes. Following this the incisions were infiltrated with Marcaine with epinephrine. Incisions were irrigated and hemostasis obtained. The breast tissue and subcutaneous tissues were closed with interrupted 3-0 Vicryl and both incisions closed with subcuticular 5-0 Monocryl and Dermabond. Sponge needle and instrument counts were correct.   Estimated Blood Loss:  Minimal         Drains: None  Blood Given: none          Specimens: #1 right breast lumpectomy #2 right axillary sentinel lymph node (2 nodes)        Complications:  * No complications entered in OR log *         Disposition: PACU - hemodynamically stable.         Condition: stable  Mariella Saa MD, FACS  02/25/2012, 3:39 PM

## 2012-02-25 NOTE — Anesthesia Postprocedure Evaluation (Signed)
  Anesthesia Post-op Note  Patient: Andrea Garrett  Procedure(s) Performed: Procedure(s) (LRB) with comments: BREAST LUMPECTOMY WITH NEEDLE LOCALIZATION AND AXILLARY SENTINEL LYMPH NODE BX (Right)  Patient Location: PACU  Anesthesia Type:General  Level of Consciousness: awake, alert , oriented and patient cooperative  Airway and Oxygen Therapy: Patient Spontanous Breathing  Post-op Pain: mild  Post-op Assessment: Post-op Vital signs reviewed, Patient's Cardiovascular Status Stable, Respiratory Function Stable, Patent Airway, No signs of Nausea or vomiting and Pain level controlled  Post-op Vital Signs: Reviewed and stable  Complications: No apparent anesthesia complications

## 2012-02-25 NOTE — Transfer of Care (Signed)
Immediate Anesthesia Transfer of Care Note  Patient: Andrea Garrett  Procedure(s) Performed: Procedure(s) (LRB) with comments: BREAST LUMPECTOMY WITH NEEDLE LOCALIZATION AND AXILLARY SENTINEL LYMPH NODE BX (Right)  Patient Location: PACU  Anesthesia Type:General  Level of Consciousness: awake, alert  and oriented  Airway & Oxygen Therapy: Patient Spontanous Breathing and Patient connected to face mask oxygen  Post-op Assessment: Report given to PACU RN and Post -op Vital signs reviewed and stable  Post vital signs: Reviewed and stable  Complications: No apparent anesthesia complications

## 2012-02-25 NOTE — Progress Notes (Signed)
Radiology tech here to perform nuc med injection. Sedation (versed and fentanyl) given and patient tol procedure well. Husband at bedside and supportive.

## 2012-02-25 NOTE — H&P (Signed)
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 This was diagnosed in August. She was on hormonal replacement at that time. She has been on neoadjuvant hormonal treatment with cessation of her estrogen replacement and she is on letrozole. She recently had a followup mammogram and ultrasound of her right breast and this has remarkably shown no imaging evidence of residual cancer. She also reports that she is no longer able to feel a lump which was clearly palpable prior to her treatment. Patient presents for elective needle loc lumpectomy and SLN Bx  Past Medical History  Diagnosis Date  . Breast cancer   . Hot flashes   . No pertinent past medical history    Past Surgical History  Procedure Date  . Tonsillectomy   . Cesarean section     x2  . Breast enhancement surgery     Bilateral  . Dilation and curettage of uterus 1988   Current Facility-Administered Medications  Medication Dose Route Frequency Provider Last Rate Last Dose  . acetaminophen (OFIRMEV) IV 1,000 mg  1,000 mg Intravenous Once E. Jairo Ben, MD      . ceFAZolin (ANCEF) IVPB 2 g/50 mL premix  2 g Intravenous On Call to OR Mariella Saa, MD      . chlorhexidine (HIBICLENS) 4 % liquid 1 application  1 application Topical Once Mariella Saa, MD      . fentaNYL (SUBLIMAZE) injection 50-100 mcg  50-100 mcg Intravenous PRN Germaine Pomfret, MD   100 mcg at 02/25/12 1350  . lactated ringers infusion   Intravenous Continuous Bedelia Person, MD 20 mL/hr at 02/25/12 1333    . midazolam (VERSED) injection 1-2 mg  1-2 mg Intravenous PRN E. Jairo Ben, MD   2 mg at 02/25/12 1350   No Known Allergies  Exam:  BP 88/61  Pulse 61  Temp 97.8 F (36.6 C) (Oral)  Resp 15  Ht 5\' 5"  (1.651 m)  Wt 120 lb 6.4 oz (54.613 kg)  BMI 20.04 kg/m2  SpO2 100%  General: Well-appearing Caucasian female  Lymph nodes: No cervical, supraclavicular, or axillary nodes  palpable  Breasts: Right breast is now normal to exam with no palpable abnormality in the 12:00 position.   Assessment and plan: ER/PR positive invasive carcinoma the right breast with complete clinical remission after just 3 months of hormonal treatment.Pt presents for needle localized lumpectomy and sentinel lymph node biopsy.Nature of the surgery, risks of bleeding, infection, implant injury, lymphedema, and possible need for further surgery has been discussed  Mariella Saa MD, FACS  02/25/2012, 2:11 PM

## 2012-02-25 NOTE — Anesthesia Preprocedure Evaluation (Signed)
Anesthesia Evaluation  Patient identified by MRN, date of birth, ID band Patient awake    Reviewed: Allergy & Precautions, H&P , NPO status , Patient's Chart, lab work & pertinent test results  History of Anesthesia Complications Negative for: history of anesthetic complications  Airway Mallampati: I TM Distance: >3 FB Neck ROM: Full    Dental  (+) Teeth Intact and Dental Advisory Given   Pulmonary neg pulmonary ROS,  breath sounds clear to auscultation  Pulmonary exam normal       Cardiovascular negative cardio ROS  Rhythm:Regular Rate:Normal     Neuro/Psych negative neurological ROS     GI/Hepatic negative GI ROS, Neg liver ROS,   Endo/Other  negative endocrine ROS  Renal/GU negative Renal ROS     Musculoskeletal   Abdominal   Peds  Hematology negative hematology ROS (+)   Anesthesia Other Findings Breast cancer  Reproductive/Obstetrics                           Anesthesia Physical Anesthesia Plan  ASA: II  Anesthesia Plan: General   Post-op Pain Management:    Induction: Intravenous  Airway Management Planned: LMA  Additional Equipment:   Intra-op Plan:   Post-operative Plan:   Informed Consent: I have reviewed the patients History and Physical, chart, labs and discussed the procedure including the risks, benefits and alternatives for the proposed anesthesia with the patient or authorized representative who has indicated his/her understanding and acceptance.   Dental advisory given  Plan Discussed with: CRNA and Surgeon  Anesthesia Plan Comments: (Plan routine monitors, GA- LMA OK)        Anesthesia Quick Evaluation

## 2012-02-25 NOTE — Anesthesia Procedure Notes (Signed)
Procedure Name: LMA Insertion Date/Time: 02/25/2012 2:24 PM Performed by: Burna Cash Pre-anesthesia Checklist: Patient identified, Emergency Drugs available, Suction available and Patient being monitored Patient Re-evaluated:Patient Re-evaluated prior to inductionOxygen Delivery Method: Circle System Utilized Preoxygenation: Pre-oxygenation with 100% oxygen Intubation Type: IV induction Ventilation: Mask ventilation without difficulty LMA: LMA inserted LMA Size: 4.0 Number of attempts: 1 Airway Equipment and Method: bite block Placement Confirmation: positive ETCO2 Tube secured with: Tape Dental Injury: Teeth and Oropharynx as per pre-operative assessment

## 2012-02-26 ENCOUNTER — Encounter (HOSPITAL_BASED_OUTPATIENT_CLINIC_OR_DEPARTMENT_OTHER): Payer: Self-pay | Admitting: General Surgery

## 2012-02-26 LAB — POCT HEMOGLOBIN-HEMACUE: Hemoglobin: 14.1 g/dL (ref 12.0–15.0)

## 2012-02-29 ENCOUNTER — Ambulatory Visit (INDEPENDENT_AMBULATORY_CARE_PROVIDER_SITE_OTHER): Payer: PRIVATE HEALTH INSURANCE | Admitting: General Surgery

## 2012-02-29 ENCOUNTER — Telehealth (INDEPENDENT_AMBULATORY_CARE_PROVIDER_SITE_OTHER): Payer: Self-pay | Admitting: General Surgery

## 2012-02-29 ENCOUNTER — Encounter (INDEPENDENT_AMBULATORY_CARE_PROVIDER_SITE_OTHER): Payer: Self-pay | Admitting: General Surgery

## 2012-02-29 VITALS — BP 112/74 | HR 76 | Temp 98.6°F | Resp 16 | Ht 65.0 in | Wt 122.0 lb

## 2012-02-29 DIAGNOSIS — C50419 Malignant neoplasm of upper-outer quadrant of unspecified female breast: Secondary | ICD-10-CM

## 2012-02-29 NOTE — Progress Notes (Signed)
History: Patient returns for an early postoperative visit concerned about some swelling and he at the nipple. We had just discussed her pathology by phone which remarkably showed no identifiable tumor in her sentinel node was negative.  Exam: BP 112/74  Pulse 76  Temp 98.6 F (37 C) (Temporal)  Resp 16  Ht 5\' 5"  (1.651 m)  Wt 122 lb (55.339 kg)  BMI 20.30 kg/m2 Breasts: Axillary and right breast incision are healing well. There is some slight puffiness around the areola but I think this represents typical postoperative swelling and no evidence of seroma or infection.  Assessment and plan: Doing well following lumpectomy and axillary  Sentinel lymph node biopsy. She is reassured. Deeper routine postop appointment in 2-3 weeks.

## 2012-02-29 NOTE — Telephone Encounter (Signed)
Pt called to report the exact area of her lumpectomy looks and feels OK.  However, the nipple is hard, painful and hot to the touch.

## 2012-02-29 NOTE — Telephone Encounter (Signed)
Pt will be worked-in to see Dr. Johna Sheriff this afternoon.

## 2012-02-29 NOTE — Telephone Encounter (Signed)
Called patient and discussed the pathology

## 2012-03-05 ENCOUNTER — Other Ambulatory Visit (HOSPITAL_BASED_OUTPATIENT_CLINIC_OR_DEPARTMENT_OTHER): Payer: PRIVATE HEALTH INSURANCE | Admitting: Lab

## 2012-03-05 DIAGNOSIS — C50419 Malignant neoplasm of upper-outer quadrant of unspecified female breast: Secondary | ICD-10-CM

## 2012-03-05 LAB — CBC WITH DIFFERENTIAL/PLATELET
Basophils Absolute: 0.1 10*3/uL (ref 0.0–0.1)
HCT: 42.5 % (ref 34.8–46.6)
HGB: 14.6 g/dL (ref 11.6–15.9)
MONO#: 0.7 10*3/uL (ref 0.1–0.9)
NEUT#: 3.9 10*3/uL (ref 1.5–6.5)
NEUT%: 57 % (ref 38.4–76.8)
WBC: 6.9 10*3/uL (ref 3.9–10.3)
lymph#: 2 10*3/uL (ref 0.9–3.3)

## 2012-03-05 LAB — COMPREHENSIVE METABOLIC PANEL (CC13)
ALT: 31 U/L (ref 0–55)
BUN: 11 mg/dL (ref 7.0–26.0)
CO2: 27 mEq/L (ref 22–29)
Calcium: 9.6 mg/dL (ref 8.4–10.4)
Chloride: 103 mEq/L (ref 98–107)
Creatinine: 0.8 mg/dL (ref 0.6–1.1)

## 2012-03-06 LAB — VITAMIN D 25 HYDROXY (VIT D DEFICIENCY, FRACTURES): Vit D, 25-Hydroxy: 59 ng/mL (ref 30–89)

## 2012-03-07 ENCOUNTER — Telehealth (INDEPENDENT_AMBULATORY_CARE_PROVIDER_SITE_OTHER): Payer: Self-pay

## 2012-03-07 NOTE — Telephone Encounter (Signed)
Message copied by Maryan Puls on Fri Mar 07, 2012  5:07 PM ------      Message from: Zollie Scale      Created: Fri Feb 29, 2012  2:57 PM      Contact: [phone]       Or [phone] please call pt with apt per at check out said three weeks to see back. Next available is in March

## 2012-03-07 NOTE — Telephone Encounter (Signed)
Called patient with 3 week follow up appointment scheduled for Friday 03/28/12 @ 9:15 am w/Dr. Johna Sheriff.

## 2012-03-12 ENCOUNTER — Encounter: Payer: Self-pay | Admitting: Radiation Oncology

## 2012-03-12 ENCOUNTER — Telehealth: Payer: Self-pay | Admitting: Oncology

## 2012-03-12 ENCOUNTER — Ambulatory Visit (HOSPITAL_BASED_OUTPATIENT_CLINIC_OR_DEPARTMENT_OTHER): Payer: PRIVATE HEALTH INSURANCE | Admitting: Oncology

## 2012-03-12 VITALS — BP 111/70 | HR 73 | Temp 98.2°F | Resp 20 | Ht 65.0 in | Wt 124.5 lb

## 2012-03-12 DIAGNOSIS — Z170 Estrogen receptor positive status [ER+]: Secondary | ICD-10-CM

## 2012-03-12 DIAGNOSIS — C50419 Malignant neoplasm of upper-outer quadrant of unspecified female breast: Secondary | ICD-10-CM

## 2012-03-12 NOTE — Telephone Encounter (Signed)
gv pt appt schedule for April and appt w/Dr. Michell Heinrich for tomorrow 1/31 @ 9am.

## 2012-03-12 NOTE — Progress Notes (Signed)
ID: Andrea Garrett   DOB: [birth year]  MR#: [id number]  WUJ#:[id number]  PCP: Shaune Pollack GYN: M. Leda Quail SU: Glenna Fellows OTHER MD: Lurline Hare, Para Skeans   HISTORY OF PRESENT ILLNESS: From Dr. Theron Arista Rubin's notes 09/26/2011: Andrea Garrett had not had a mammogram in some time or at least for 10 years. Andrea Garrett detected a mass in Andrea Garrett right breast about a month ago. Andrea Garrett subsequently sought medical attention for this. Andrea Garrett diagnostic mammogram and right breast ultrasound performed 09/11/2011 showed a mass at the 12:00 position 5 cm from nipple measuring 1.9 x 1.7 x 1 cm. A biopsy was performed on 09/20/2011 and this showed a invasive ductal cancer, grade 1/2. This was ER +90% PR +90% and Andrea Garrett-2 was nonamplified. Ki-67 was low. An MRI scan was performed on 09/25/2011 and this showed a mass measuring 2.4 x 2.2 x 1.4 cm Andrea Garrett subsequent history is as detailed below  INTERVAL HISTORY: Andrea Garrett returns today with Andrea Garrett husband Elijah Birk for followup of Andrea Garrett breast cancer. Since Andrea Garrett last visit here Andrea Garrett had Andrea Garrett definitive surgery under Dr. Johna Sheriff. Andrea Garrett is establishing herself in my practice today.  REVIEW OF SYSTEMS: Andrea Garrett did well with Andrea Garrett right lumpectomy, without unusual pain, fever, bleeding, erythema, or swelling. A detailed review of systems today was otherwise entirely negative. Andrea Garrett exercises regularly, chiefly by walking.   PAST MEDICAL HISTORY: Past Medical History  Diagnosis Date  . Breast cancer 09/20/11    right, inv ductal ca, ER/PR +, Andrea Garrett 2 -  . Hot flashes   . No pertinent past medical history     PAST SURGICAL HISTORY: Past Surgical History  Procedure Date  . Tonsillectomy   . Cesarean section     x2  . Breast enhancement surgery     Bilateral  . Dilation and curettage of uterus 1988  . Breast lumpectomy with needle localization and axillary sentinel lymph node bx 02/25/2012    Procedure: BREAST LUMPECTOMY WITH NEEDLE LOCALIZATION AND AXILLARY SENTINEL LYMPH NODE BX;  Surgeon: Mariella Saa, MD;  Location: [facility];  Service: General;  Laterality: Right;  . Breast biopsy 09/20/11    Right- inv ductal ca, DCIS    FAMILY HISTORY Family History  Problem Relation Age of Onset  . Breast cancer Maternal Aunt    the patient's parents are alive, in their late 64s. The patient has one brother and one sister. The patient's mother is only sister was diagnosed with breast cancer at age 58. There is no other breast or ovarian cancer in the family to the patient's knowledge.  GYNECOLOGIC HISTORY: Menarche age 80, first live birth age 15, Andrea Garrett is GX P2. Andrea Garrett stopped having periods approximately 2007. Andrea Garrett took hormone replacement approximately 4 years.  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 Andrea Garrett husband Elijah Birk is in Audiological scientist estate, Development worker, international aid homes. Son Andrea Garrett in [place] works in Public relations account executive, and son Andrea Garrett in [place] is in Financial trader. The patient has no grandchildren. Andrea Garrett attends the [employer] locally.   ADVANCED DIRECTIVES: In place  HEALTH MAINTENANCE: History  Substance Use Topics  . Smoking status: Never Smoker   . Smokeless tobacco: Not on file  . Alcohol Use: No     Colonoscopy:  PAP: 2012  Bone density:  Lipid panel:  No Known Allergies  Current Outpatient Prescriptions  Medication Sig Dispense Refill  . fish oil-omega-3 fatty acids 1000 MG capsule Take 2 g by mouth daily.      Marland Kitchen  HYDROcodone-acetaminophen (NORCO/VICODIN) 5-325 MG per tablet Take 1-2 tablets by mouth every 4 (four) hours as needed for pain.  30 tablet  1  . letrozole (FEMARA) 2.5 MG tablet       . Multiple Vitamins-Minerals (MULTIVITAMIN WITH MINERALS) tablet Take 1 tablet by mouth daily.      . Probiotic Product (PRO-BIOTIC BLEND PO) Take 1 tablet by mouth.      . psyllium (METAMUCIL) 58.6 % powder Take 1 packet by mouth 3 (three) times daily.        OBJECTIVE: Middle-aged white woman who appears well Filed  Vitals:   03/12/12 0909  BP: 111/70  Pulse: 73  Temp: 98.2 F (36.8 C)  Resp: 20     Body mass index is 20.72 kg/(m^2).    ECOG FS: 0  Sclerae unicteric Oropharynx clear No cervical or supraclavicular adenopathy Lungs no rales or rhonchi Heart regular rate and rhythm Abd benign MSK no focal spinal tenderness, no peripheral edema Neuro: nonfocal Breasts: The right breast is status post recent lumpectomy and axillary skin sentinel lymph node sampling. The cosmetic effect is excellent. Andrea Garrett has bilateral implants in place. There is no evidence of local recurrence on the right, and the right axilla is benign. The left breast is unremarkable.   LAB RESULTS: Lab Results  Component Value Date   WBC 6.9 03/05/2012   NEUTROABS 3.9 03/05/2012   HGB 14.6 03/05/2012   HCT 42.5 03/05/2012   MCV 91.2 03/05/2012   PLT 225 03/05/2012      Chemistry      Component Value Date/Time   NA 139 03/05/2012 1304   NA 139 09/26/2011 0822   K 4.1 03/05/2012 1304   K 3.6 09/26/2011 0822   CL 103 03/05/2012 1304   CL 101 09/26/2011 0822   CO2 27 03/05/2012 1304   CO2 29 09/26/2011 0822   BUN 11.0 03/05/2012 1304   BUN 11 09/26/2011 0822   CREATININE 0.8 03/05/2012 1304   CREATININE 0.75 09/26/2011 0822      Component Value Date/Time   CALCIUM 9.6 03/05/2012 1304   CALCIUM 9.6 09/26/2011 0822   ALKPHOS 67 03/05/2012 1304   ALKPHOS 73 09/26/2011 0822   AST 26 03/05/2012 1304   AST 23 09/26/2011 0822   ALT 31 03/05/2012 1304   ALT 23 09/26/2011 0822   BILITOT 0.54 03/05/2012 1304   BILITOT 0.5 09/26/2011 9604       Lab Results  Component Value Date   LABCA2 20 03/05/2012    No components found with this basename: [id number]    No results found for this basename: INR:1;PROTIME:1 in the last 168 hours  Urinalysis No results found for this basename: colorurine, appearanceur, labspec, phurine, glucoseu, hgbur, bilirubinur, ketonesur, proteinur, urobilinogen, nitrite, leukocytesur    STUDIES: Right breast  ultrasound 12/27/2011 Clinical Data: Known right breast cancer diagnosed August 2013.  The patient has been on neoadjuvant therapy. Mammogram to assess  neoadjuvant therapy response.  DIGITAL DIAGNOSTIC RIGHT MAMMOGRAM WITH CAD AND RIGHT BREAST  ULTRASOUND:  Comparison: September 11, 2011  Findings: [place] CC and MLO views of the right breast, right MLO  view are submitted. The previously noted mass in the upper right  breast is not seen. Biopsy clip is seen in place of previously  noted mass.  Mammographic images were processed with CAD.  Ultrasound is performed, showing no focal discrete cystic or solid  lesion identified at the right breast 12 o'clock position 5 cm from  nipple, site of  prior biopsy proven cancer.  IMPRESSION:  Known right breast cancer.  RECOMMENDATION:  Surgical excision.  I have discussed the findings and recommendations with the patient.  Results were also provided in writing at the conclusion of the  visit.   Nm Sentinel Node Inj-no Rpt (breast)  02/25/2012  CLINICAL DATA: cancer of the right breast   Sulfur colloid was injected intradermally by the nuclear medicine  technologist for breast cancer sentinel node localization.     Mm Plc Breast Loc Dev   1st Lesion  Inc Mammo Guide  02/25/2012  *RADIOLOGY REPORT*  Clinical Data:  57 year old female with right breast cancer - wire localization prior to right lumpectomy.  NEEDLE LOCALIZATION WITH MAMMOGRAPHIC GUIDANCE AND SPECIMEN RADIOGRAPH  Comparison:  Previous exams.  Patient presents for needle localization prior to right lumpectomy. I met with the patient and we discussed the procedure of needle localization including benefits and alternatives. We discussed the high likelihood of a successful procedure. We discussed the risks of the procedure, including infection, bleeding, tissue injury, and further surgery. Informed, written consent was given.  Using mammographic guidance, sterile technique, 2% lidocaine and a 5  cm modified Kopans needle, the biopsy clip was localized using a superior approach.  The films are marked for Dr. Johna Sheriff.  Specimen radiograph was performed at day surgery, and confirms the wire and biopsy clip present in the tissue sample.  The specimen is marked for pathology.  IMPRESSION: Needle localization right breast.  No apparent complications.   Original Report Authenticated By: Harmon Pier, M.D.     ASSESSMENT: 58 y.o. [place] woman  (1) status post right breast biopsy 09/20/2011 for a clinical T2 N0, stage IIA invasive ductal carcinoma, grade 1 or 2, estrogen receptor 100% and progesterone receptor 99% positive, with an MIB-1 of 10% and no Andrea Garrett-2 amplification  (2) Oncotype DX score of 6 predicts a 5% risk of distant recurrence within 10 years if the patient's only systemic treatment is tamoxifen for 5 years  (3) started neoadjuvant hormone therapy August 2013  (4) status post right lumpectomy and sentinel lymph node sampling 02/25/2012, showing a complete pathologic response  (5) DEXA scan 10/16/2011 shows osteoporosis  PLAN: We spent the better part of Andrea Garrett hour-plus visit today going over the biology of Andrea Garrett tumor, Andrea Garrett treatment history, and the results of treatment. Andrea Garrett has an excellent prognosis both because of Andrea Garrett Oncotype score and because of the complete pathologic response at the time of surgery. From a systemic point of view the plan is going to be to continue anastrozole for total of 5 years, and possibly longer.  We went over the fact that the standard of care is for Andrea Garrett to receive postlumpectomy radiation. Andrea Garrett is very concerned regarding this of because of possible long-term side effects including cosmetic distortion of Andrea Garrett lumpectomy breast. I have made Andrea Garrett a followup appointment with Dr. Hal Neer to discuss the pros and cons of proceeding with adjuvant radiation.  We then discussed the osteoporosis situation. Andrea Garrett is walking regularly and taking calcium and  vitamin D. I think Andrea Garrett may benefit from pharmacologic treatment, and we discussed calcitonin, Forteo, and the bisphosphonates. My suggestion would be to go with zoledronic acid once yearly for 2 years, and I gave Andrea Garrett some written information on the possible toxicities, side effects and complications of this medication. We will make a decision regarding that at the next visit which will be in 3 months. Andrea Garrett knows to call for any problems that  may develop before then.     MAGRINAT,GUSTAV C    03/12/2012

## 2012-03-13 ENCOUNTER — Encounter: Payer: Self-pay | Admitting: Radiation Oncology

## 2012-03-13 ENCOUNTER — Ambulatory Visit
Admission: RE | Admit: 2012-03-13 | Discharge: 2012-03-13 | Disposition: A | Discharge status: Home / Self Care | Payer: PRIVATE HEALTH INSURANCE | Source: Ambulatory Visit | Attending: Radiation Oncology | Admitting: Radiation Oncology

## 2012-03-13 VITALS — BP 115/68 | HR 67 | Temp 98.1°F | Resp 20 | Ht 65.0 in | Wt 125.6 lb

## 2012-03-13 DIAGNOSIS — Z79899 Other long term (current) drug therapy: Secondary | ICD-10-CM | POA: Insufficient documentation

## 2012-03-13 DIAGNOSIS — C50419 Malignant neoplasm of upper-outer quadrant of unspecified female breast: Secondary | ICD-10-CM

## 2012-03-13 DIAGNOSIS — C50919 Malignant neoplasm of unspecified site of unspecified female breast: Secondary | ICD-10-CM | POA: Insufficient documentation

## 2012-03-13 NOTE — Progress Notes (Signed)
Department of Radiation Oncology  Phone:  [phone] Fax:        [phone]   Name: Andrea Garrett   DOB: 1955-05-20  MRN: [id number]    Date: 03/13/2012  Follow Up Visit Note  Diagnosis: T1cN0 Invasive Ductal Carcinoma of the Left Breast  Interval History: Andrea Garrett presents today for routine followup.  She was treated with neoadjuvant antiestrogen therapy. She took letrozole for about 3 months and had a complete clinical response. No residual cancer to be found on ultrasound or mammogram. She underwent a lumpectomy by Dr. Odie Sera on 02/25/2012. This showed benign breast parenchyma with treatment effect. Zero out of two sentinel lymph nodes were positive. She is healed up well from her surgery. She is very reluctant to undergo radiation. She is concerned about the effects of radiation on her implant as well as on the rest of her normal tissues. She also mentioned she is concerned about cost. Her son has encouraged her to pursue radiation. She agreed to speak with me today regarding pros and cons of treatment.  Allergies: No Known Allergies  Medications:  Current Outpatient Prescriptions  Medication Sig Dispense Refill  . fish oil-omega-3 fatty acids 1000 MG capsule Take 2 g by mouth daily.      Marland Kitchen HYDROcodone-acetaminophen (NORCO/VICODIN) 5-325 MG per tablet Take 1-2 tablets by mouth every 4 (four) hours as needed for pain.  30 tablet  1  . letrozole (FEMARA) 2.5 MG tablet       . Multiple Vitamins-Minerals (MULTIVITAMIN WITH MINERALS) tablet Take 1 tablet by mouth daily.      . Probiotic Product (PRO-BIOTIC BLEND PO) Take 1 tablet by mouth.      . psyllium (METAMUCIL) 58.6 % powder Take 1 packet by mouth 3 (three) times daily.        Physical Exam:   height is 5\' 5"  (1.651 m) and weight is 125 lb 9.6 oz (56.972 kg). Her oral temperature is 98.1 F (36.7 C). Her blood pressure is 115/68 and her pulse is 67. Her respiration is 20.  Is a pleasant female in no distress sitting  comfortably examining room chair.  IMPRESSION: Andrea Garrett is a 57 y.o. female status post lumpectomy for a T1 N0 invasive ductal carcinoma of the right breast  PLAN:  I spoke with Andrea Garrett and her husband today. She had a remarkable response to neoadjuvant antiestrogen therapy. She also had stopped her estrogen replacement therapy. She has no residual disease. We discussed the high recurrence rate seen in patients who underwent neoadjuvant chemotherapy and did not take radiation after a pathologic complete response in the breast. These rate approach 30% in the reported series. This is not exactly her situation but I told her there is really no data that I have to guide not recommending radiation. I discussed local control. I discussed that the cancer could come back in the breast and could be a more aggressive phenotype. We discussed that although it's unlikely cancer can come back in the breast as well as the rest of her body. I discussed with her the low risk of serious side effects regarding radiation to the right breast. We discussed the process of simulation and the placement tattoos. We discussed more treatments with lower dose per fraction to decrease late effects. I discussed with her that most patients with implants do fine and do not have implant contractures. I told her and her husband that my recommendation was for her to have radiation. She said understood all of these  things but felt like the right decision for her was to not undergo radiation. She stated she felt like she had a fresh start and was comfortable not pursuing radiation. She said she did not feel she could live with the side effects if they occurred. She was more comfortable living with the chance of recurrence. She assured me that she would undergo close mammographic and clinical followup. I inquired whether she had refills on her letrozole prescription and she stated that she had one refill left would contact Dr. Darrall Dears office for  further refills. I let her know that we have up until 3 months after surgery to start radiation so she changed her mind she can always contact me and weekly get her simulation and treatment started. I will see her back on a when necessary basis. She has followup scheduled with medical oncology.    Lurline Hare, MD

## 2012-03-13 NOTE — Progress Notes (Addendum)
Married, husband w/pt today, pt works w/her husband, 2 sons 57 and 82  Menarche age 57, G75, P22, menopause 2007, Premarin cream since 2007  Pt denies pain today

## 2012-03-13 NOTE — Progress Notes (Signed)
Please see the Nurse Progress Note in the MD Initial Consult Encounter for this patient. 

## 2012-03-18 ENCOUNTER — Other Ambulatory Visit: Payer: Self-pay | Admitting: Oncology

## 2012-03-18 NOTE — Progress Notes (Signed)
[facility] has decided againsy XRT. We will have to consider MRI for follow-up if we can get ut approved  GM

## 2012-03-28 ENCOUNTER — Encounter (INDEPENDENT_AMBULATORY_CARE_PROVIDER_SITE_OTHER): Payer: PRIVATE HEALTH INSURANCE | Admitting: General Surgery

## 2012-03-30 ENCOUNTER — Other Ambulatory Visit: Payer: Self-pay

## 2012-04-23 ENCOUNTER — Encounter (INDEPENDENT_AMBULATORY_CARE_PROVIDER_SITE_OTHER): Payer: PRIVATE HEALTH INSURANCE | Admitting: General Surgery

## 2012-04-25 ENCOUNTER — Other Ambulatory Visit: Payer: Self-pay | Admitting: *Deleted

## 2012-04-29 ENCOUNTER — Other Ambulatory Visit: Payer: Self-pay | Admitting: *Deleted

## 2012-04-29 DIAGNOSIS — C50419 Malignant neoplasm of upper-outer quadrant of unspecified female breast: Secondary | ICD-10-CM

## 2012-04-29 MED ORDER — LETROZOLE 2.5 MG PO TABS: 2.5000 mg | ORAL_TABLET | Freq: Every day | ORAL | Status: DC

## 2012-05-05 ENCOUNTER — Other Ambulatory Visit: Payer: Self-pay | Admitting: Oncology

## 2012-05-05 ENCOUNTER — Other Ambulatory Visit: Payer: Self-pay | Admitting: *Deleted

## 2012-05-05 ENCOUNTER — Other Ambulatory Visit (HOSPITAL_BASED_OUTPATIENT_CLINIC_OR_DEPARTMENT_OTHER): Payer: PRIVATE HEALTH INSURANCE | Admitting: Lab

## 2012-05-05 DIAGNOSIS — C50411 Malignant neoplasm of upper-outer quadrant of right female breast: Secondary | ICD-10-CM

## 2012-05-05 DIAGNOSIS — C50419 Malignant neoplasm of upper-outer quadrant of unspecified female breast: Secondary | ICD-10-CM

## 2012-05-05 LAB — CBC WITH DIFFERENTIAL/PLATELET
BASO%: 1.3 % (ref 0.0–2.0)
EOS%: 5.8 % (ref 0.0–7.0)
Eosinophils Absolute: 0.3 10*3/uL (ref 0.0–0.5)
MCHC: 34.4 g/dL (ref 31.5–36.0)
MCV: 90.6 fL (ref 79.5–101.0)
MONO%: 11 % (ref 0.0–14.0)
NEUT#: 2.2 10*3/uL (ref 1.5–6.5)
RBC: 4.85 10*6/uL (ref 3.70–5.45)
RDW: 12.6 % (ref 11.2–14.5)
WBC: 5.1 10*3/uL (ref 3.9–10.3)

## 2012-05-05 LAB — COMPREHENSIVE METABOLIC PANEL (CC13)
ALT: 22 U/L (ref 0–55)
AST: 22 U/L (ref 5–34)
Albumin: 3.6 g/dL (ref 3.5–5.0)
Alkaline Phosphatase: 63 U/L (ref 40–150)
Glucose: 96 mg/dl (ref 70–99)
Potassium: 4.3 mEq/L (ref 3.5–5.1)
Sodium: 142 mEq/L (ref 136–145)
Total Protein: 6.6 g/dL (ref 6.4–8.3)

## 2012-05-05 LAB — LIPID PANEL
LDL Cholesterol: 138 mg/dL — ABNORMAL HIGH (ref 0–99)
VLDL: 17 mg/dL (ref 0–40)

## 2012-06-10 ENCOUNTER — Telehealth: Payer: Self-pay | Admitting: Oncology

## 2012-06-10 ENCOUNTER — Other Ambulatory Visit: Payer: PRIVATE HEALTH INSURANCE | Admitting: Lab

## 2012-06-10 ENCOUNTER — Ambulatory Visit (HOSPITAL_BASED_OUTPATIENT_CLINIC_OR_DEPARTMENT_OTHER): Payer: PRIVATE HEALTH INSURANCE | Admitting: Oncology

## 2012-06-10 VITALS — BP 114/74 | HR 69 | Temp 98.2°F | Resp 20 | Ht 65.0 in | Wt 123.7 lb

## 2012-06-10 DIAGNOSIS — Z170 Estrogen receptor positive status [ER+]: Secondary | ICD-10-CM

## 2012-06-10 DIAGNOSIS — M810 Age-related osteoporosis without current pathological fracture: Secondary | ICD-10-CM

## 2012-06-10 DIAGNOSIS — C50919 Malignant neoplasm of unspecified site of unspecified female breast: Secondary | ICD-10-CM

## 2012-06-10 DIAGNOSIS — C50419 Malignant neoplasm of upper-outer quadrant of unspecified female breast: Secondary | ICD-10-CM

## 2012-06-10 NOTE — Progress Notes (Signed)
ID: JANNE FAULK   DOB: 1955/08/17  MR#: [id number]  WUJ#:[id number]  PCP: Shaune Pollack GYN: M. Leda Quail SU: Glenna Fellows OTHER MD: Lurline Hare, Para Skeans   HISTORY OF PRESENT ILLNESS: From Dr. Theron Arista Rubin's notes 09/26/2011:  She had not had a mammogram in some time or at least for 10 years. She detected a mass in her right breast about a month ago. She subsequently sought medical attention for this. Her diagnostic mammogram and right breast ultrasound performed 09/11/2011 showed a mass at the 12:00 position 5 cm from nipple measuring 1.9 x 1.7 x 1 cm. A biopsy was performed on 09/20/2011 and this showed a invasive ductal cancer, grade 1/2. This was ER +90% PR +90% and HER-2 was nonamplified. Ki-67 was low. An MRI scan was performed on 09/25/2011 and this showed a mass measuring 2.4 x 2.2 x 1.4 cm"  Her subsequent history is as detailed below  INTERVAL HISTORY: Antaniya returns today with her husband Elijah Birk for followup of her breast cancer. Since her last visit here she considered radiation and decided against it. She is continuing on letrozole, but wonders if  the possible side effects might be greater than the possible benefits.Marland Kitchen  REVIEW OF SYSTEMS: She has a "bump" in the DIP joint of the second finger of the right hand, which is hard, nontender, fixed, and I believe may be a sesamoid. It is not a worked. It is not consistent with gout. She is having hot flashes, but not significant problems with vaginal dryness. She's exercising regularly 3 or 4 times a week. A detailed review of systems today was otherwise noncontributory  PAST MEDICAL HISTORY: Past Medical History  Diagnosis Date  . Breast cancer 09/20/11    right, inv ductal ca, ER/PR +, HER 2 -  . Hot flashes   . No pertinent past medical history     PAST SURGICAL HISTORY: Past Surgical History  Procedure Laterality Date  . Tonsillectomy    . Cesarean section      x2  . Breast enhancement surgery      Bilateral   . Dilation and curettage of uterus  1988  . Breast lumpectomy with needle localization and axillary sentinel lymph node bx  02/25/2012    Procedure: BREAST LUMPECTOMY WITH NEEDLE LOCALIZATION AND AXILLARY SENTINEL LYMPH NODE BX;  Surgeon: Mariella Saa, MD;  Location: [facility];  Service: General;  Laterality: Right;  . Breast biopsy  09/20/11    Right- inv ductal ca, DCIS    FAMILY HISTORY Family History  Problem Relation Age of Onset  . Breast cancer Maternal Aunt    the patient's parents are alive, in their late 33s. The patient has one brother and one sister. The patient's mother's only sister was diagnosed with breast cancer at age 11. There is no other breast or ovarian cancer in the family to the patient's knowledge.  GYNECOLOGIC HISTORY: Menarche age 84, first live birth age 69, she is GX P2. She stopped having periods approximately 2007. She took hormone replacement approximately 4 years.  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 Her husband Elijah Birk is in Audiological scientist estate, Development worker, international aid homes. Son Gerlene Burdock in [place] works in Public relations account executive, and son Christiane Ha in [place] is in Financial trader. The patient has no grandchildren. She attends the [employer] locally.   ADVANCED DIRECTIVES: In place  HEALTH MAINTENANCE: History  Substance Use Topics  . Smoking status: Never Smoker   .  Smokeless tobacco: Not on file  . Alcohol Use: No     Colonoscopy:  PAP: 2012  Bone density:  Lipid panel:  No Known Allergies  Current Outpatient Prescriptions  Medication Sig Dispense Refill  . fish oil-omega-3 fatty acids 1000 MG capsule Take 2 g by mouth daily.      Marland Kitchen HYDROcodone-acetaminophen (NORCO/VICODIN) 5-325 MG per tablet Take 1-2 tablets by mouth every 4 (four) hours as needed for pain.  30 tablet  1  . letrozole (FEMARA) 2.5 MG tablet Take 1 tablet (2.5 mg total) by mouth daily.  30 tablet  1  . Multiple  Vitamins-Minerals (MULTIVITAMIN WITH MINERALS) tablet Take 1 tablet by mouth daily.      . Probiotic Product (PRO-BIOTIC BLEND PO) Take 1 tablet by mouth.      . psyllium (METAMUCIL) 58.6 % powder Take 1 packet by mouth 3 (three) times daily.       No current facility-administered medications for this visit.    OBJECTIVE: Middle-aged white woman in no acute distress Filed Vitals:   06/10/12 1002  BP: 114/74  Pulse: 69  Temp: 98.2 F (36.8 C)  Resp: 20     Body mass index is 20.58 kg/(m^2).    ECOG FS: 0  Sclerae unicteric Oropharynx clear No cervical or supraclavicular adenopathy Lungs no rales or rhonchi Heart regular rate and rhythm Abd soft, nontender, positive bowel sounds MSK no focal spinal tenderness, no peripheral edema Neuro: nonfocal, well oriented, anxious affect Breasts: The right breast is status post lumpectomy with no evidence of local recurrence. The cosmetic effect is good. She has bilateral implants in place. The right axilla is benign. The left breast is unremarkable.   LAB RESULTS: Lab Results  Component Value Date   WBC 5.1 05/05/2012   NEUTROABS 2.2 05/05/2012   HGB 15.1 05/05/2012   HCT 43.9 05/05/2012   MCV 90.6 05/05/2012   PLT 194 05/05/2012      Chemistry      Component Value Date/Time   NA 142 05/05/2012 0828   NA 139 09/26/2011 0822   K 4.3 05/05/2012 0828   K 3.6 09/26/2011 0822   CL 107 05/05/2012 0828   CL 101 09/26/2011 0822   CO2 27 05/05/2012 0828   CO2 29 09/26/2011 0822   BUN 12.3 05/05/2012 0828   BUN 11 09/26/2011 0822   CREATININE 0.9 05/05/2012 0828   CREATININE 0.75 09/26/2011 0822      Component Value Date/Time   CALCIUM 9.4 05/05/2012 0828   CALCIUM 9.6 09/26/2011 0822   ALKPHOS 63 05/05/2012 0828   ALKPHOS 73 09/26/2011 0822   AST 22 05/05/2012 0828   AST 23 09/26/2011 0822   ALT 22 05/05/2012 0828   ALT 23 09/26/2011 0822   BILITOT 0.62 05/05/2012 0828   BILITOT 0.5 09/26/2011 4098       Lab Results  Component Value Date    LABCA2 20 03/05/2012    No components found with this basename: [id number]    No results found for this basename: INR,  in the last 168 hours  Urinalysis No results found for this basename: colorurine,  appearanceur,  labspec,  phurine,  glucoseu,  hgbur,  bilirubinur,  ketonesur,  proteinur,  urobilinogen,  nitrite,  leukocytesur    STUDIES: No results found.  ASSESSMENT: 57 y.o. [place] woman  (1) status post right breast biopsy 09/20/2011 for a clinical T2 N0, stage IIA invasive ductal carcinoma, grade 1 or 2, estrogen receptor 100% and progesterone receptor  99% positive, with an MIB-1 of 10% and no HER-2 amplification  (2) Oncotype DX score of 6 predicts a 5% risk of distant recurrence within 10 years if the patient's only systemic treatment is tamoxifen for 5 years  (3) started neoadjuvant letrozole August 2013  (4) status post right lumpectomy and sentinel lymph node sampling 02/25/2012, showing a complete pathologic response  (5) decided against adjuvant radiation  (5) DEXA scan 10/16/2011 shows osteoporosis  PLAN: Elfreda had questions today regarding how long to continue the letrozole she understands the standard of care is for 5 years and that we do not have data that shorter is as good. In tamoxifen we do have data at that shorter is definitely worse. We know from randomized studies that 5 years of tamoxifen is better than 2 years, and that 10 years of tamoxifen is better than 5. The data we have, then, support longer, not shorter use of anti-estrogens.  The adjuvant! database would quote her a risk of recurrence of 36% with local treatment only. The fact that she had a complete pathologic response to her neoadjuvant letrozole is certainly encouraging, but we cannot extrapolate from data in which complete pathologic response due to chemotherapy significantly improves long-term prognosis, to assuming a similar benefit when the complete pathologic response is due to  antiestrogens. In fact many tumors have many subclones, and it is expected that the chemotherapy would be most effective at a limiting the more aggressive subclones.  Her Oncotype DX shows an excellent prognosis with 5 years of anti-estrogens. That remains my recommendation. I also would consider biannual MRIs given the fact that she did not receive radiation, however their insurance does not kick in until they have spent $12,000 of their own money. Accordingly it may be difficult to get the planned MRIs for her.  Eilleen has a very good understanding of all this. She understands also that we respect our patient's decisions. If she decides to go off anti-estrogens we will do our best to continue to screen her for the earliest detectable signs of recurrence, so she can avoid stage IV disease in the future.  She will see [place] again in 3 months for routine followup. She will let us know if there are any questions or concerns before then.   Malayja Freund C    06/10/2012

## 2012-06-10 NOTE — Telephone Encounter (Signed)
, °

## 2012-06-11 ENCOUNTER — Telehealth: Payer: Self-pay | Admitting: Oncology

## 2012-06-11 NOTE — Telephone Encounter (Signed)
Sent letter to Dr.Donna Kevan Ny office.

## 2012-07-01 ENCOUNTER — Other Ambulatory Visit: Payer: Self-pay | Admitting: *Deleted

## 2012-07-01 DIAGNOSIS — C50419 Malignant neoplasm of upper-outer quadrant of unspecified female breast: Secondary | ICD-10-CM

## 2012-07-01 MED ORDER — LETROZOLE 2.5 MG PO TABS: 2.5000 mg | ORAL_TABLET | Freq: Every day | ORAL | Status: DC

## 2012-08-27 ENCOUNTER — Telehealth: Payer: Self-pay | Admitting: *Deleted

## 2012-08-27 NOTE — Telephone Encounter (Signed)
Pt called to cancel her appt for 09/09/12.Marland Kitchentd

## 2012-09-09 ENCOUNTER — Ambulatory Visit: Payer: PRIVATE HEALTH INSURANCE | Admitting: Physician Assistant

## 2012-10-02 ENCOUNTER — Other Ambulatory Visit: Payer: Self-pay

## 2012-10-02 DIAGNOSIS — C50419 Malignant neoplasm of upper-outer quadrant of unspecified female breast: Secondary | ICD-10-CM

## 2012-10-02 MED ORDER — LETROZOLE 2.5 MG PO TABS: 2.5000 mg | ORAL_TABLET | Freq: Every day | ORAL | Status: DC

## 2012-12-18 ENCOUNTER — Other Ambulatory Visit: Payer: Self-pay

## 2013-12-14 ENCOUNTER — Encounter: Payer: Self-pay | Admitting: Radiation Oncology

## 2014-12-23 ENCOUNTER — Emergency Department (HOSPITAL_COMMUNITY): Payer: 59

## 2014-12-23 ENCOUNTER — Encounter (HOSPITAL_COMMUNITY): Payer: Self-pay | Admitting: *Deleted

## 2014-12-23 ENCOUNTER — Emergency Department (HOSPITAL_COMMUNITY)
Admission: EM | Admit: 2014-12-23 | Discharge: 2014-12-23 | Disposition: A | Discharge status: Home / Self Care | Payer: 59 | Attending: Emergency Medicine | Admitting: Emergency Medicine

## 2014-12-23 DIAGNOSIS — Z79899 Other long term (current) drug therapy: Secondary | ICD-10-CM | POA: Diagnosis not present

## 2014-12-23 DIAGNOSIS — Z8742 Personal history of other diseases of the female genital tract: Secondary | ICD-10-CM | POA: Insufficient documentation

## 2014-12-23 DIAGNOSIS — Z853 Personal history of malignant neoplasm of breast: Secondary | ICD-10-CM | POA: Insufficient documentation

## 2014-12-23 DIAGNOSIS — R079 Chest pain, unspecified: Secondary | ICD-10-CM | POA: Insufficient documentation

## 2014-12-23 LAB — BASIC METABOLIC PANEL
ANION GAP: 11 (ref 5–15)
BUN: 11 mg/dL (ref 6–20)
CALCIUM: 10.1 mg/dL (ref 8.9–10.3)
CO2: 26 mmol/L (ref 22–32)
CREATININE: 0.84 mg/dL (ref 0.44–1.00)
Chloride: 103 mmol/L (ref 101–111)
GFR calc Af Amer: >60 mL/min (ref >60)
GLUCOSE: 93 mg/dL (ref 65–99)
Potassium: 4.1 mmol/L (ref 3.5–5.1)
Sodium: 140 mmol/L (ref 135–145)

## 2014-12-23 LAB — CBC
HCT: 45.8 % (ref 36.0–46.0)
HEMOGLOBIN: 15.7 g/dL — AB (ref 12.0–15.0)
MCH: 31.2 pg (ref 26.0–34.0)
MCHC: 34.3 g/dL (ref 30.0–36.0)
MCV: 91.1 fL (ref 78.0–100.0)
Platelets: 213 10*3/uL (ref 150–400)
RBC: 5.03 MIL/uL (ref 3.87–5.11)
RDW: 12.7 % (ref 11.5–15.5)
WBC: 6.6 10*3/uL (ref 4.0–10.5)

## 2014-12-23 LAB — I-STAT TROPONIN, ED
TROPONIN I, POC: 0.01 ng/mL (ref 0.00–0.08)
Troponin i, poc: 0.01 ng/mL (ref 0.00–0.08)

## 2014-12-23 NOTE — Discharge Instructions (Signed)
Nonspecific Chest Pain  °Chest pain can be caused by many different conditions. There is always a chance that your pain could be related to something serious, such as a heart attack or a blood clot in your lungs. Chest pain can also be caused by conditions that are not life-threatening. If you have chest pain, it is very important to follow up with your health care provider. °CAUSES  °Chest pain can be caused by: °· Heartburn. °· Pneumonia or bronchitis. °· Anxiety or stress. °· Inflammation around your heart (pericarditis) or lung (pleuritis or pleurisy). °· A blood clot in your lung. °· A collapsed lung (pneumothorax). It can develop suddenly on its own (spontaneous pneumothorax) or from trauma to the chest. °· Shingles infection (varicella-zoster virus). °· Heart attack. °· Damage to the bones, muscles, and cartilage that make up your chest wall. This can include: °¨ Bruised bones due to injury. °¨ Strained muscles or cartilage due to frequent or repeated coughing or overwork. °¨ Fracture to one or more ribs. °¨ Sore cartilage due to inflammation (costochondritis). °RISK FACTORS  °Risk factors for chest pain may include: °· Activities that increase your risk for trauma or injury to your chest. °· Respiratory infections or conditions that cause frequent coughing. °· Medical conditions or overeating that can cause heartburn. °· Heart disease or family history of heart disease. °· Conditions or health behaviors that increase your risk of developing a blood clot. °· Having had chicken pox (varicella zoster). °SIGNS AND SYMPTOMS °Chest pain can feel like: °· Burning or tingling on the surface of your chest or deep in your chest. °· Crushing, pressure, aching, or squeezing pain. °· Dull or sharp pain that is worse when you move, cough, or take a deep breath. °· Pain that is also felt in your back, neck, shoulder, or arm, or pain that spreads to any of these areas. °Your chest pain may come and go, or it may stay  constant. °DIAGNOSIS °Lab tests or other studies may be needed to find the cause of your pain. Your health care provider may have you take a test called an ambulatory ECG (electrocardiogram). An ECG records your heartbeat patterns at the time the test is performed. You may also have other tests, such as: °· Transthoracic echocardiogram (TTE). During echocardiography, sound waves are used to create a picture of all of the heart structures and to look at how blood flows through your heart. °· Transesophageal echocardiogram (TEE). This is a more advanced imaging test that obtains images from inside your body. It allows your health care provider to see your heart in finer detail. °· Cardiac monitoring. This allows your health care provider to monitor your heart rate and rhythm in real time. °· Holter monitor. This is a portable device that records your heartbeat and can help to diagnose abnormal heartbeats. It allows your health care provider to track your heart activity for several days, if needed. °· Stress tests. These can be done through exercise or by taking medicine that makes your heart beat more quickly. °· Blood tests. °· Imaging tests. °TREATMENT  °Your treatment depends on what is causing your chest pain. Treatment may include: °· Medicines. These may include: °¨ Acid blockers for heartburn. °¨ Anti-inflammatory medicine. °¨ Pain medicine for inflammatory conditions. °¨ Antibiotic medicine, if an infection is present. °¨ Medicines to dissolve blood clots. °¨ Medicines to treat coronary artery disease. °· Supportive care for conditions that do not require medicines. This may include: °¨ Resting. °¨ Applying heat   or cold packs to injured areas. °¨ Limiting activities until pain decreases. °HOME CARE INSTRUCTIONS °· If you were prescribed an antibiotic medicine, finish it all even if you start to feel better. °· Avoid any activities that bring on chest pain. °· Do not use any tobacco products, including  cigarettes, chewing tobacco, or electronic cigarettes. If you need help quitting, ask your health care provider. °· Do not drink alcohol. °· Take medicines only as directed by your health care provider. °· Keep all follow-up visits as directed by your health care provider. This is important. This includes any further testing if your chest pain does not go away. °· If heartburn is the cause for your chest pain, you may be told to keep your head raised (elevated) while sleeping. This reduces the chance that acid will go from your stomach into your esophagus. °· Make lifestyle changes as directed by your health care provider. These may include: °¨ Getting regular exercise. Ask your health care provider to suggest some activities that are safe for you. °¨ Eating a heart-healthy diet. A registered dietitian can help you to learn healthy eating options. °¨ Maintaining a healthy weight. °¨ Managing diabetes, if necessary. °¨ Reducing stress. °SEEK MEDICAL CARE IF: °· Your chest pain does not go away after treatment. °· You have a rash with blisters on your chest. °· You have a fever. °SEEK IMMEDIATE MEDICAL CARE IF:  °· Your chest pain is worse. °· You have an increasing cough, or you cough up blood. °· You have severe abdominal pain. °· You have severe weakness. °· You faint. °· You have chills. °· You have sudden, unexplained chest discomfort. °· You have sudden, unexplained discomfort in your arms, back, neck, or jaw. °· You have shortness of breath at any time. °· You suddenly start to sweat, or your skin gets clammy. °· You feel nauseous or you vomit. °· You suddenly feel light-headed or dizzy. °· Your heart begins to beat quickly, or it feels like it is skipping beats. °These symptoms may represent a serious problem that is an emergency. Do not wait to see if the symptoms will go away. Get medical help right away. Call your local emergency services (911 in the [place] Do not drive yourself to the hospital. °  °This  information is not intended to replace advice given to you by your health care provider. Make sure you discuss any questions you have with your health care provider. °  °Document Released: 11/08/2004 Document Revised: 02/19/2014 Document Reviewed: 09/04/2013 °Elsevier Interactive Patient Education ©2016 [employer] ° °

## 2014-12-23 NOTE — ED Notes (Signed)
Patient left at this time with all belongings. 

## 2014-12-23 NOTE — ED Notes (Signed)
Pt in from her PCP c/o mid back pain that radiates to mid chest onset today @ 11am, the pt reports chewing 324mg  ASA, pt reports pain resolved after 11 mins, pt denies current CP, SOB, n/v/d, A&O x4

## 2014-12-23 NOTE — ED Provider Notes (Signed)
CSN: [id number]     Arrival date & time 12/23/14  1654 History   First MD Initiated Contact with Patient 12/23/14 1833     Chief Complaint  Patient presents with  . Chest Pain   Andrea Garrett is a 59 y.o. female with history of breast cancer and questionable hyperlipidemia who presents to the emergency department complaining of substernal chest pain that went through to her back around 11 AM this morning. She reports she was walking around when she started having substernal chest pain that moved to her mid back and lasted for approximately 11 minutes. She reports she took 324 mg aspirin and then her chest pain resolved. She reports she's been chest pain-free since it initially resolved. She denies having any shortness of breath or palpitations with her chest pain. She denies personal or close family history of MI. She denies personal or close family history of DVTs or PEs. She denies personal or close family history of blood clotting disorders such as factor V Leiden, protein C or S deficiency. She denies recent long travel, smoking or endogenous estrogen use. She denies fevers, chills, recent illness, cough, wheezing, numbness, tingling, weakness, abdominal pain, nausea, vomiting, diarrhea, lightheadedness, syncope, leg pain, leg swelling, or rashes.  (Consider location/radiation/quality/duration/timing/severity/associated sxs/prior Treatment) HPI  Past Medical History  Diagnosis Date  . Breast cancer ([place]) 09/20/11    right, inv ductal ca, ER/PR +, HER 2 -  . Hot flashes   . No pertinent past medical history    Past Surgical History  Procedure Laterality Date  . Tonsillectomy    . Cesarean section      x2  . Breast enhancement surgery      Bilateral  . Dilation and curettage of uterus  1988  . Breast lumpectomy with needle localization and axillary sentinel lymph node bx  02/25/2012    Procedure: BREAST LUMPECTOMY WITH NEEDLE LOCALIZATION AND AXILLARY SENTINEL LYMPH NODE BX;  Surgeon:  Edward Jolly, MD;  Location: [facility];  Service: General;  Laterality: Right;  . Breast biopsy  09/20/11    Right- inv ductal ca, DCIS   Family History  Problem Relation Age of Onset  . Breast cancer Maternal Aunt    Social History  Substance Use Topics  . Smoking status: Never Smoker   . Smokeless tobacco: None  . Alcohol Use: No   OB History    Gravida Para Term Preterm AB TAB SAB Ectopic Multiple Living   3 2              Obstetric Comments   Menarche age 3, G22, P37, menopause 2007, Premarin cream since 2007     Review of Systems  Constitutional: Negative for fever and chills.  HENT: Negative for congestion and sore throat.   Eyes: Negative for visual disturbance.  Respiratory: Negative for cough, shortness of breath and wheezing.   Cardiovascular: Positive for chest pain (resolved. ). Negative for palpitations and leg swelling.  Gastrointestinal: Negative for nausea, vomiting, abdominal pain and diarrhea.  Genitourinary: Negative for dysuria, urgency, frequency, hematuria and difficulty urinating.  Musculoskeletal: Negative for back pain and neck pain.  Skin: Negative for rash.  Neurological: Negative for dizziness, syncope, weakness, light-headedness, numbness and headaches.      Allergies  Review of patient's allergies indicates no known allergies.  Home Medications   Prior to Admission medications   Medication Sig Start Date End Date Taking? Authorizing Provider  fish oil-omega-3 fatty acids 1000 MG capsule Take  2 g by mouth daily.   Yes Historical Provider, MD  letrozole (FEMARA) 2.5 MG tablet Take 1 tablet (2.5 mg total) by mouth daily. 10/02/12  Yes Chauncey Cruel, MD  Multiple Vitamins-Minerals (MULTIVITAMIN WITH MINERALS) tablet Take 1 tablet by mouth daily.   Yes Historical Provider, MD  Probiotic Product (PRO-BIOTIC BLEND PO) Take 1 tablet by mouth.   Yes Historical Provider, MD   BP 100/70 mmHg  Pulse 65  Resp 16  Ht 5\' 5"   (1.651 m)  Wt 125 lb (56.7 kg)  BMI 20.80 kg/m2  SpO2 99% Physical Exam  Constitutional: She appears well-developed and well-nourished. No distress.  Nontoxic appearing.  HENT:  Head: Normocephalic and atraumatic.  Mouth/Throat: Oropharynx is clear and moist.  Eyes: Conjunctivae are normal. Pupils are equal, round, and reactive to light. Right eye exhibits no discharge. Left eye exhibits no discharge.  Neck: Normal range of motion. Neck supple. No JVD present. No tracheal deviation present.  Cardiovascular: Normal rate, regular rhythm, normal heart sounds and intact distal pulses.  Exam reveals no gallop and no friction rub.   No murmur heard. Bilateral radial, posterior tibialis and dorsalis pedis pulses are intact.    Pulmonary/Chest: Effort normal and breath sounds normal. No respiratory distress. She has no wheezes. She has no rales. She exhibits no tenderness.  Lungs are clear to auscultation bilaterally. No chest wall tenderness.  Abdominal: Soft. Bowel sounds are normal. She exhibits no distension. There is no tenderness. There is no guarding.  Musculoskeletal: She exhibits no edema or tenderness.  No lower extremity edema or tenderness. No back tenderness to palpation.  Lymphadenopathy:    She has no cervical adenopathy.  Neurological: She is alert. Coordination normal.  Skin: Skin is warm and dry. No rash noted. She is not diaphoretic. No erythema. No pallor.  Psychiatric: She has a normal mood and affect. Her behavior is normal.  Nursing note and vitals reviewed.   ED Course  Procedures (including critical care time) Labs Review Labs Reviewed  CBC - Abnormal; Notable for the following:    Hemoglobin 15.7 (*)    All other components within normal limits  BASIC METABOLIC PANEL  I-STAT TROPOININ, ED  I-STAT TROPOININ, ED    Imaging Review Dg Chest 2 View  12/23/2014  CLINICAL DATA:  Two episodes of chest pain in past 2 weeks, today lasted 12 minutes, centralized pain  radiating to back, personal history of breast cancer EXAM: CHEST  2 VIEW COMPARISON:  None FINDINGS: Normal heart size, mediastinal contours, and pulmonary vascularity. Atherosclerotic calcification aorta. Lungs appear emphysematous but clear. No pulmonary infiltrate, pleural effusion or pneumothorax. Diffuse osseous demineralization. IMPRESSION: No acute abnormalities. Electronically Signed   By: Lavonia Dana M.D.   On: 12/23/2014 17:36   I have personally reviewed and evaluated these images and lab results as part of my medical decision-making.   EKG Interpretation   Date/Time:  Thursday December 23 2014 17:04:10 EST Ventricular Rate:  81 PR Interval:  160 QRS Duration: 80 QT Interval:  344 QTC Calculation: 399 R Axis:   67 Text Interpretation:  Normal sinus rhythm Nonspecific ST and T wave  abnormality Abnormal ECG No old tracing to compare Confirmed by JACUBOWITZ   MD, SAM [phone]) on 12/23/2014 7:40:20 PM     Filed Vitals:   12/23/14 2015 12/23/14 2045 12/23/14 2046 12/23/14 2115  BP: 113/67 119/86 119/86 100/70  Pulse: 58 69 76 65  Resp: 14 15 15 16   Height:  Weight:      SpO2: 99% 98% 98% 99%    MDM   Final diagnoses:  Chest pain, unspecified chest pain type   This  is a 59 y.o. female with history of breast cancer and questionable hyperlipidemia who presents to the emergency department complaining of substernal chest pain that went through to her back around 11 AM this morning. She reports she was walking around when she started having substernal chest pain that moved to her mid back and lasted for approximately 11 minutes. She reports she took 324 mg aspirin and then her chest pain resolved. She reports she's been chest pain-free since it initially resolved. She denies having any shortness of breath or palpitations with her chest pain. She denies personal or close family history of MI. Patient presented with chest pain to the ED. Patient is to be discharged with  recommendation to follow up with PCP and cardiology for stress test in regards to today's hospital visit. Chest pain is not likely of cardiac or pulmonary etiology due to presentation, perc negative, VSS, no tracheal deviation, no JVD or new murmur, RRR, breath sounds equal bilaterally, EKG without acute abnormalities, negative troponin, and negative CXR. HEART score is 3. Patient has been advised to return to the ED if chest pain becomes exertional, associated with diaphoresis or nausea, radiates to left jaw/arm, worsens or becomes concerning in any way. Patient appears reliable for follow up and is agreeable to discharge. I advised the patient to follow-up with their primary care provider this week. I advised the patient to return to the emergency department with new or worsening symptoms or new concerns. The patient verbalized understanding and agreement with plan.    This patient was discussed with Dr. Winfred Leeds who agrees with assessment and plan.      Waynetta Pean, PA-C 12/23/14 2132  Orlie Dakin, MD 12/24/14 [phone]

## 2014-12-23 NOTE — ED Notes (Signed)
PA at bedside.

## 2014-12-28 ENCOUNTER — Telehealth: Payer: Self-pay | Admitting: Cardiology

## 2014-12-28 NOTE — Telephone Encounter (Signed)
Received records from [facility] for appointment on 11.17.16 with Dr Martinique.  Records given to [facility] (medical records) for Dr Doug Sou schedule on 12/30/14. lp

## 2014-12-30 ENCOUNTER — Encounter: Payer: Self-pay | Admitting: Cardiology

## 2014-12-30 ENCOUNTER — Ambulatory Visit (INDEPENDENT_AMBULATORY_CARE_PROVIDER_SITE_OTHER): Payer: 59 | Admitting: Cardiology

## 2014-12-30 VITALS — BP 128/80 | HR 64 | Ht 65.0 in | Wt 128.4 lb

## 2014-12-30 DIAGNOSIS — R079 Chest pain, unspecified: Secondary | ICD-10-CM | POA: Diagnosis not present

## 2014-12-30 DIAGNOSIS — E7800 Pure hypercholesterolemia, unspecified: Secondary | ICD-10-CM

## 2014-12-30 NOTE — Patient Instructions (Signed)
We will schedule you for a stress Echo.   

## 2014-12-30 NOTE — Progress Notes (Signed)
Cardiology Office Note   Date:  12/30/2014   ID:  Andrea Garrett, Andrea Garrett 1955/10/16, MRN [id number]  PCP:  Marjorie Smolder, MD  Cardiologist:   Peter Martinique, MD   Chief Complaint  Patient presents with  . New Evaluation    pt c/o no chest pain no SOB no edema no light headedness or dizziness      History of Present Illness: Andrea Garrett is a 59 y.o. female who presents for evaluation of chest pain. She has a history of hypercholesterolemia and family history of CAD. This past Thursday she was driving in a car when she noted sudden onset of severe chest pain. Thinks it began in her mid back then thru to the anterior chest. It brought tears to her eyes. Described it as very painful. Chewed up an ASA and within 12 minutes pain had resolved. Went to the ED and cardiac troponins were negative x 2. No prior cardiac history. 2 weeks prior to this episode she had milder chest pain that awoke her from sleep. 4 days later she states she had an episode where her heart hurt. She has a history of breast CA s/p lumpectomy and breast reconstruction. No RT.     Past Medical History  Diagnosis Date  . Breast cancer ([place]) 09/20/11    right, inv ductal ca, ER/PR +, HER 2 -  . Hot flashes   . No pertinent past medical history     Past Surgical History  Procedure Laterality Date  . Tonsillectomy    . Cesarean section      x2  . Breast enhancement surgery      Bilateral  . Dilation and curettage of uterus  1988  . Breast lumpectomy with needle localization and axillary sentinel lymph node bx  02/25/2012    Procedure: BREAST LUMPECTOMY WITH NEEDLE LOCALIZATION AND AXILLARY SENTINEL LYMPH NODE BX;  Surgeon: Edward Jolly, MD;  Location: [facility];  Service: General;  Laterality: Right;  . Breast biopsy  09/20/11    Right- inv ductal ca, DCIS     Current Outpatient Prescriptions  Medication Sig Dispense Refill  . fish oil-omega-3 fatty acids 1000 MG capsule Take 2 g by  mouth daily.    . Multiple Vitamins-Minerals (MULTIVITAMIN WITH MINERALS) tablet Take 1 tablet by mouth daily.    . Probiotic Product (PRO-BIOTIC BLEND PO) Take 1 tablet by mouth.     No current facility-administered medications for this visit.    Allergies:   Review of patient's allergies indicates no known allergies.    Social History:  The patient  reports that she has never smoked. She does not have any smokeless tobacco history on file. She reports that she does not drink alcohol or use illicit drugs.   Family History:  The patient's family history includes Breast cancer in her maternal aunt; CAD (age of onset: 47) in her father; Hypertension in her father and mother.    ROS:  Please see the history of present illness.   Otherwise, review of systems are positive for none.   All other systems are reviewed and negative.    PHYSICAL EXAM: VS:  BP 128/80 mmHg  Pulse 64  Ht 5\' 5"  (1.651 m)  Wt 58.242 kg (128 lb 6.4 oz)  BMI 21.37 kg/m2 , BMI Body mass index is 21.37 kg/(m^2). GEN: Well nourished, well developed, in no acute distress HEENT: normal Neck: no JVD, carotid bruits, or masses Cardiac: RRR; no murmurs, rubs,  or gallops,no edema  Respiratory:  clear to auscultation bilaterally, normal work of breathing GI: soft, nontender, nondistended, + BS MS: no deformity or atrophy Skin: warm and dry, no rash Neuro:  Strength and sensation are intact Psych: euthymic mood, full affect   EKG:  EKG is not ordered today. The ekg 12/23/14  demonstrates NSR with nonspecific T wave abnormality. I have personally reviewed and interpreted this study.    Recent Labs: 12/23/2014: BUN 11; Creatinine, Ser 0.84; Hemoglobin 15.7*; Platelets 213; Potassium 4.1; Sodium 140    Lipid Panel    Component Value Date/Time   CHOL 223* 05/05/2012 0925   TRIG 87 05/05/2012 0925   HDL 68 05/05/2012 0925   CHOLHDL 3.3 05/05/2012 0925   VLDL 17 05/05/2012 0925   LDLCALC 138* 05/05/2012 0925        Wt Readings from Last 3 Encounters:  12/30/14 58.242 kg (128 lb 6.4 oz)  12/23/14 56.7 kg (125 lb)  06/10/12 56.11 kg (123 lb 11.2 oz)    Lab Results  Component Value Date   WBC 6.6 12/23/2014   HGB 15.7* 12/23/2014   HCT 45.8 12/23/2014   PLT 213 12/23/2014   GLUCOSE 93 12/23/2014   CHOL 223* 05/05/2012   TRIG 87 05/05/2012   HDL 68 05/05/2012   LDLCALC 138* 05/05/2012   ALT 22 05/05/2012   AST 22 05/05/2012   NA 140 12/23/2014   K 4.1 12/23/2014   CL 103 12/23/2014   CREATININE 0.84 12/23/2014   BUN 11 12/23/2014   CO2 26 12/23/2014     Other studies Reviewed: Additional studies/ records that were reviewed today include: none. Review of the above records demonstrates: N/A   ASSESSMENT AND PLAN:  1.  Atypical chest pain. Risk factors of hypercholesterolemia and family history of CAD. Recommend further evaluation with stress testing. Given resting TWA I think regular ETT would have a high potential for false positive results. Will schedule a stress Echo. I realize her breast implants may limit imaging but I think this will give [place] an adequate test. Would like to avoid radiation with nuclear stress testing. If normal we will reassure.   2. Mild hypercholesterolemia.   Current medicines are reviewed at length with the patient today.  The patient does not have concerns regarding medicines.  The following changes have been made:  no change  Labs/ tests ordered today include:  Orders Placed This Encounter  Procedures  . Echo stress     Disposition:   FU TBD  Signed, Peter Martinique, MD  12/30/2014 4:44 PM    [facility] [address] [place], [place], [address] Phone [phone], Fax [phone]

## 2015-01-12 ENCOUNTER — Other Ambulatory Visit (HOSPITAL_COMMUNITY): Payer: 59

## 2016-04-18 DIAGNOSIS — R7989 Other specified abnormal findings of blood chemistry: Secondary | ICD-10-CM | POA: Insufficient documentation

## 2020-11-02 NOTE — Progress Notes (Signed)
Cardiology Office Note:    Date:  11/03/2020   ID:  Nahla, Andrea Garrett 14, 1957, MRN [id number]  PCP:  Glenis Smoker, MD  Cardiologist:  None   Referring MD: Glenis Smoker, *   Chief Complaint  Patient presents with   Chest Pain     History of Present Illness:    Andrea Garrett is a 65 y.o. female with a hx of referred for evaluation of chest pain. Family h/o CAD, with personal h/o breast CA, and hyperlipidemia. No calcium on non coronary chest CT 2013.   Since May 2022, she has experienced recurring episodes of pressure in her chest lasts anywhere from 5 to 10 minutes.  She walks 2 miles 3 days a week and has been doing this for years.  She notices a similar chest pressure with walking that goes away after several minutes.  The discomfort has not prevented her from participating in her usual activities.  She has not a smoker.  She does have hyperlipidemia that is not treated.  Her father had a stent in his late 21s.  He is still living at age 64.  She is not diabetic.  Episodes of discomfort that occur at rest are associated with headache.  Past Medical History:  Diagnosis Date   Breast cancer ([place]) 09/20/2011   right, inv ductal ca, ER/PR +, HER 2 -   Hot flashes    No pertinent past medical history    Vitamin deficiency     Past Surgical History:  Procedure Laterality Date   BREAST BIOPSY  09/20/11   Right- inv ductal ca, DCIS   BREAST ENHANCEMENT SURGERY     Bilateral   BREAST LUMPECTOMY WITH NEEDLE LOCALIZATION AND AXILLARY SENTINEL LYMPH NODE BX  02/25/2012   Procedure: BREAST LUMPECTOMY WITH NEEDLE LOCALIZATION AND AXILLARY SENTINEL LYMPH NODE BX;  Surgeon: Edward Jolly, MD;  Location: [facility];  Service: General;  Laterality: Right;   CESAREAN SECTION     x2   DILATION AND CURETTAGE OF UTERUS  1988   TONSILLECTOMY      Current Medications: Current Meds  Medication Sig   fish oil-omega-3 fatty acids 1000 MG capsule Take  2 g by mouth daily.   Multiple Vitamins-Minerals (MULTIVITAMIN WITH MINERALS) tablet Take 1 tablet by mouth daily.   Probiotic Product (PRO-BIOTIC BLEND PO) Take 1 tablet by mouth.     Allergies:   Patient has no known allergies.   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  Tobacco Use   Smoking status: Never   Smokeless tobacco: Never  Substance and Sexual Activity   Alcohol use: No   Drug use: No   Sexual activity: Not on file  Other Topics Concern   Not on file  Social History Narrative   Not on file   Social Determinants of Health   Financial Resource Strain: Not on file  Food Insecurity: Not on file  Transportation Needs: Not on file  Physical Activity: Not on file  Stress: Not on file  Social Connections: Not on file     Family History: The patient's family history includes Breast cancer in her maternal aunt; CAD (age of onset: 75) in her father; Hypertension in her father and  mother.  ROS:   Please see the history of present illness.    Family history of hemochromatosis.  She has concern about whether hemochromatosis could be causing chest tightness.  She had COVID-19 in April prior to onset of her current chest pain symptoms.  All other systems reviewed and are negative.  EKGs/Labs/Other Studies Reviewed:    The following studies were reviewed today: No prior work-up.  Noncardiac CT in 2014 did not demonstrate coronary calcification.  EKG:  EKG normal sinus rhythm, poor wave progression V1 through V3.  Left atrial abnormality.  Nonspecific T wave abnormality.  Compared to prior of 12/2014, no changes noted.  Recent Labs: No results found for requested labs within last 8760 hours.  Recent Lipid Panel    Component Value Date/Time   CHOL 223 (H) 05/05/2012 0925    TRIG 87 05/05/2012 0925   HDL 68 05/05/2012 0925   CHOLHDL 3.3 05/05/2012 0925   VLDL 17 05/05/2012 0925   LDLCALC 138 (H) 05/05/2012 0925    Physical Exam:    VS:  BP 128/68   Pulse 69   Ht 5\' 5"  (1.651 m)   Wt 118 lb 12.8 oz (53.9 kg)   SpO2 96%   BMI 19.77 kg/m     Wt Readings from Last 3 Encounters:  11/03/20 118 lb 12.8 oz (53.9 kg)  12/30/14 128 lb 6.4 oz (58.2 kg)  12/23/14 125 lb (56.7 kg)     GEN: Slender. No acute distress HEENT: Normal NECK: No JVD. LYMPHATICS: No lymphadenopathy CARDIAC: No murmur. RRR no gallop, or edema. VASCULAR:  Normal Pulses. No bruits. RESPIRATORY:  Clear to auscultation without rales, wheezing or rhonchi  ABDOMEN: Soft, non-tender, non-distended, No pulsatile mass, MUSCULOSKELETAL: No deformity  SKIN: Warm and dry NEUROLOGIC:  Alert and oriented x 3 PSYCHIATRIC:  Normal affect   ASSESSMENT:    1. Chest pain at rest   2. Hypercholesteremia   3. Precordial pain    PLAN:    In order of problems listed above:  Coronary CTA with FFR if indicated in a patient with risk factors including hypercholesterolemia, family history CAD, and age.  Symptoms are intermediate with features that are not totally convincing for angina. LDLs above 130 dating back to 2014.  Overall education and awareness concerning primary risk prevention was discussed in detail: LDL less than 70, hemoglobin A1c less than 7, blood pressure target less than 130/80 mmHg, >150 minutes of moderate aerobic activity per week, avoidance of smoking, weight control (via diet and exercise), and continued surveillance/management of/for obstructive sleep apnea.  Refused coronary CT because of concerns about receiving contrast.  Therefore a stress nuclear and coronary calcium score will be performed.  Medication Adjustments/Labs and Tests Ordered: Current medicines are reviewed at length with the patient today.  Concerns regarding medicines are outlined above.  Orders Placed  This Encounter  Procedures   CT CORONARY MORPH W/CTA COR W/SCORE W/CA W/CM &/OR WO/CM   Basic metabolic panel   EKG [id number]    No orders of the defined types were placed in this encounter.   There are no Patient Instructions on file for this visit.   Signed, Sinclair Grooms, MD  11/03/2020 10:24 AM    [place]

## 2020-11-03 ENCOUNTER — Other Ambulatory Visit: Payer: Self-pay

## 2020-11-03 ENCOUNTER — Encounter: Payer: Self-pay | Admitting: *Deleted

## 2020-11-03 ENCOUNTER — Encounter: Payer: Self-pay | Admitting: Interventional Cardiology

## 2020-11-03 ENCOUNTER — Ambulatory Visit (INDEPENDENT_AMBULATORY_CARE_PROVIDER_SITE_OTHER): Payer: Medicare Other | Admitting: Interventional Cardiology

## 2020-11-03 VITALS — BP 128/68 | HR 69 | Ht 65.0 in | Wt 118.8 lb

## 2020-11-03 DIAGNOSIS — R072 Precordial pain: Secondary | ICD-10-CM | POA: Diagnosis not present

## 2020-11-03 DIAGNOSIS — R079 Chest pain, unspecified: Secondary | ICD-10-CM | POA: Diagnosis not present

## 2020-11-03 DIAGNOSIS — E7800 Pure hypercholesterolemia, unspecified: Secondary | ICD-10-CM

## 2020-11-03 NOTE — Patient Instructions (Addendum)
Medication Instructions:  Your physician recommends that you continue on your current medications as directed. Please refer to the Current Medication list given to you today.  *If you need a refill on your cardiac medications before your next appointment, please call your pharmacy*   Lab Work: BMET today  If you have labs (blood work) drawn today and your tests are completely normal, you will receive your results only by: [place] (if you have MyChart) OR A paper copy in the mail If you have any lab test that is abnormal or we need to change your treatment, we will call you to review the results.   Testing/Procedures: Your physician recommends that you have a Calcium Score performed.  Your physician has requested that you have en exercise stress myoview. For further information please visit [email or web address]. Please follow instruction sheet, as given.    Follow-Up: At [facility], you and your health needs are our priority.  As part of our continuing mission to provide you with exceptional heart care, we have created designated Provider Care Teams.  These Care Teams include your primary Cardiologist (physician) and Advanced Practice Providers (APPs -  Physician Assistants and Nurse Practitioners) who all work together to provide you with the care you need, when you need it.  We recommend signing up for the patient portal called "MyChart".  Sign up information is provided on this After Visit Summary.  MyChart is used to connect with patients for Virtual Visits (Telemedicine).  Patients are able to view lab/test results, encounter notes, upcoming appointments, etc.  Non-urgent messages can be sent to your provider as well.   To learn more about what you can do with MyChart, go to [email or web address].    Your next appointment:   As needed  The format for your next appointment:   In Person  Provider:   You may see Daneen Schick, MD or one of the following Advanced  Practice Providers on your designated Care Team:   Cecilie Kicks, NP   Other Instructions

## 2020-11-08 NOTE — Addendum Note (Signed)
Addended by: Belva Crome on: 11/08/2020 11:56 AM   Modules accepted: Orders

## 2020-11-08 NOTE — Addendum Note (Signed)
Addended by: Loren Racer on: 11/08/2020 11:28 AM   Modules accepted: Orders

## 2020-11-10 ENCOUNTER — Telehealth (HOSPITAL_COMMUNITY): Payer: Self-pay | Admitting: *Deleted

## 2020-11-10 NOTE — Telephone Encounter (Signed)
Patient given detailed instructions per Myocardial Perfusion Study Information Sheet for the test on 11/16/20  Patient notified to arrive 15 minutes early and that it is imperative to arrive on time for appointment to keep from having the test rescheduled.  If you need to cancel or reschedule your appointment, please call the office within 24 hours of your appointment. . Patient verbalized understanding. Kirstie Peri

## 2020-11-16 ENCOUNTER — Other Ambulatory Visit: Payer: Self-pay

## 2020-11-16 ENCOUNTER — Encounter (HOSPITAL_COMMUNITY): Payer: Medicare Other

## 2020-11-22 NOTE — Progress Notes (Signed)
New Hematology/Oncology Consult   Requesting MD: Dr. Sela Hilding  [phone]  Reason for Consult: Elevated ferritin  HPI: Ms. Boss has been referred for evaluation of an elevated ferritin.  Labs from 08/24/2020 returned with a ferritin of 281 (15-150).  Repeat ferritin on 10/07/2020 was within normal range at 172 (11-306).  She reports a family history of hemochromatosis.  She reports high ferritin levels for "years".  She intermittently adjusts her diet to decrease the level.  She reports her mother has the hemochromatosis gene, has never required treatment.  She thinks her maternal grandfather had hemochromatosis and possibly died from complications.  2 maternal uncles with hemochromatosis, 1 uncle donates blood regularly to manage ferritin levels.  The other uncle possibly died from complications of hemochromatosis.  Past Medical History:  Diagnosis Date   Breast cancer ([place]) 09/20/2011   right, inv ductal ca, ER/PR +, HER 2 -   Hot flashes    No pertinent past medical history    Vitamin deficiency   :   Past Surgical History:  Procedure Laterality Date   BREAST BIOPSY  09/20/11   Right- inv ductal ca, DCIS   BREAST ENHANCEMENT SURGERY     Bilateral   BREAST LUMPECTOMY WITH NEEDLE LOCALIZATION AND AXILLARY SENTINEL LYMPH NODE BX  02/25/2012   Procedure: BREAST LUMPECTOMY WITH NEEDLE LOCALIZATION AND AXILLARY SENTINEL LYMPH NODE BX;  Surgeon: Edward Jolly, MD;  Location: [facility];  Service: General;  Laterality: Right;   CESAREAN SECTION     x2   DILATION AND CURETTAGE OF UTERUS  1988   TONSILLECTOMY    :   Current Outpatient Medications:    fish oil-omega-3 fatty acids 1000 MG capsule, Take 2 g by mouth daily., Disp: , Rfl:    Multiple Vitamins-Minerals (MULTIVITAMIN WITH MINERALS) tablet, Take 1 tablet by mouth daily., Disp: , Rfl:    Probiotic Product (PRO-BIOTIC BLEND PO), Take 1 tablet by mouth., Disp: , Rfl: :   No Known  Allergies:  FH: Mother with hemochromatosis gene; maternal grandfather possibly had hemochromatosis and may have died from complications; maternal uncle with hemochromatosis, died of a massive heart attack; maternal uncle with hemochromatosis, donates blood regularly to manage ferritin levels.  SOCIAL HISTORY: She lives in [place].  She is married.  She has 2 healthy sons.  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  No tobacco or alcohol use.  Review of Systems: Lately she has felt fatigued.  Nightly hot flashes for the past 3 months.  No associated fever.  She denies joint pain.  No rash.  No skin changes.  No anorexia or weight loss.  She denies dysphagia.  No cough or shortness of breath.  No nausea or vomiting.  No change in bowel habits.  She does regular self breast exams and has noted no abnormality.  She does not do mammograms due to the implants/associated pain.  Physical Exam:  Blood pressure 130/74, pulse 66, temperature 98.7 F (37.1 C), temperature source Oral, resp. rate 18, height 5' 5"  (1.651 m), weight 119 lb 6.4 oz (54.2 kg), SpO2 100 %.  HEENT: No thrush or ulcers. Lungs: Lungs clear bilaterally. Cardiac: Regular rate and rhythm. Abdomen: Abdomen soft and nontender.  No hepatosplenomegaly. Vascular: No leg edema. Lymph nodes: No palpable cervical, supraclavicular, axillary or inguinal lymph nodes. Neurologic: Alert and oriented. Skin: No rash. Breast: Bilateral implants.  Right breast scar is unremarkable.  LABS:  No results for input(s): WBC, HGB, HCT,  PLT in the last 72 hours.  No results for input(s): NA, K, CL, CO2, GLUCOSE, BUN, CREATININE, CALCIUM in the last 72 hours.    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   Started neoadjuvant hormone therapy August 2013.  Right lumpectomy and sentinel lymph node sampling 02/25/2012 showing a complete pathologic response.  She reports completing approximately 1 year of anastrozole.  Ms. Lannan has been referred for evaluation of an elevated ferritin.  She reports a family history of hemochromatosis.  Dr. Benay Spice reviewed the diagnosis of hemochromatosis with her at today's visit, potential associated complications.  She will return to the lab today for a repeat ferritin level and hemochromatosis gene testing.  We will contact her once results are available and determine appropriate follow-up.  She will discuss the significant pain with prior mammograms with Dr. Lindell Noe, see if there are other options for screening.  Patient seen with Dr. Benay Spice.  Ned Card, NP 11/23/2020, 11:53 AM   Ms. Pluta is referred for evaluation of an elevated ferritin level and family history of hemochromatosis.  We have a low clinical suspicion for homozygous hereditary hemochromatosis in her case.  We will check a ferritin level and hemochromatosis gene study today.  We discussed the genetics of hemochromatosis and clinical findings when disease is present.  We encouraged her to have screening ammography or breast MRIs with her history of breast cancer.  I was present for greater than 50% of today's visit.  I performed medical decision making.  Julieanne Manson, MD

## 2020-11-23 ENCOUNTER — Other Ambulatory Visit: Payer: Self-pay

## 2020-11-23 ENCOUNTER — Inpatient Hospital Stay: Payer: Medicare Other

## 2020-11-23 ENCOUNTER — Inpatient Hospital Stay: Payer: Medicare Other | Attending: Nurse Practitioner | Admitting: Nurse Practitioner

## 2020-11-23 ENCOUNTER — Encounter: Payer: Self-pay | Admitting: Nurse Practitioner

## 2020-11-23 VITALS — BP 130/74 | HR 66 | Temp 98.7°F | Resp 18 | Ht 65.0 in | Wt 119.4 lb

## 2020-11-23 DIAGNOSIS — Z853 Personal history of malignant neoplasm of breast: Secondary | ICD-10-CM | POA: Diagnosis not present

## 2020-11-23 DIAGNOSIS — Z832 Family history of diseases of the blood and blood-forming organs and certain disorders involving the immune mechanism: Secondary | ICD-10-CM | POA: Insufficient documentation

## 2020-11-23 DIAGNOSIS — R7989 Other specified abnormal findings of blood chemistry: Secondary | ICD-10-CM | POA: Insufficient documentation

## 2020-11-23 LAB — FERRITIN: Ferritin: 158 ng/mL (ref 11–307)

## 2020-12-02 LAB — HEMOCHROMATOSIS DNA-PCR(C282Y,H63D)

## 2020-12-08 ENCOUNTER — Telehealth: Payer: Self-pay

## 2020-12-08 NOTE — Telephone Encounter (Signed)
Called no answer message lef concerning most recent ferritn levels and genetic marker for hemochromatosis encouraged pt to return call for question concerns or changes

## 2020-12-08 NOTE — Telephone Encounter (Signed)
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

## 2020-12-20 ENCOUNTER — Emergency Department (HOSPITAL_COMMUNITY): Payer: Medicare Other

## 2020-12-20 ENCOUNTER — Encounter (HOSPITAL_COMMUNITY): Payer: Self-pay | Admitting: Emergency Medicine

## 2020-12-20 ENCOUNTER — Other Ambulatory Visit: Payer: Self-pay

## 2020-12-20 ENCOUNTER — Emergency Department (HOSPITAL_COMMUNITY)
Admission: EM | Admit: 2020-12-20 | Discharge: 2020-12-20 | Disposition: A | Discharge status: Home / Self Care | Payer: Medicare Other | Attending: Emergency Medicine | Admitting: Emergency Medicine

## 2020-12-20 DIAGNOSIS — W1839XA Other fall on same level, initial encounter: Secondary | ICD-10-CM | POA: Diagnosis not present

## 2020-12-20 DIAGNOSIS — S42201A Unspecified fracture of upper end of right humerus, initial encounter for closed fracture: Secondary | ICD-10-CM | POA: Insufficient documentation

## 2020-12-20 DIAGNOSIS — S4991XA Unspecified injury of right shoulder and upper arm, initial encounter: Secondary | ICD-10-CM | POA: Diagnosis present

## 2020-12-20 DIAGNOSIS — Y9301 Activity, walking, marching and hiking: Secondary | ICD-10-CM | POA: Insufficient documentation

## 2020-12-20 DIAGNOSIS — M25511 Pain in right shoulder: Secondary | ICD-10-CM | POA: Diagnosis not present

## 2020-12-20 DIAGNOSIS — Y9289 Other specified places as the place of occurrence of the external cause: Secondary | ICD-10-CM | POA: Insufficient documentation

## 2020-12-20 DIAGNOSIS — Z853 Personal history of malignant neoplasm of breast: Secondary | ICD-10-CM | POA: Insufficient documentation

## 2020-12-20 MED ORDER — HYDROMORPHONE HCL 1 MG/ML IJ SOLN
1.0000 mg | Freq: Once | INTRAMUSCULAR | Status: AC
  Administered 2020-12-20: 1 mg via INTRAVENOUS
  Filled 2020-12-20: qty 1

## 2020-12-20 MED ORDER — SODIUM CHLORIDE 0.9 % IV BOLUS
1000.0000 mL | Freq: Once | INTRAVENOUS | Status: AC
  Administered 2020-12-20: 1000 mL via INTRAVENOUS

## 2020-12-20 MED ORDER — LORAZEPAM 2 MG/ML IJ SOLN
1.0000 mg | Freq: Once | INTRAMUSCULAR | Status: AC
  Administered 2020-12-20: 1 mg via INTRAVENOUS
  Filled 2020-12-20: qty 1

## 2020-12-20 MED ORDER — ONDANSETRON HCL 4 MG/2ML IJ SOLN
4.0000 mg | Freq: Once | INTRAMUSCULAR | Status: AC
  Administered 2020-12-20: 4 mg via INTRAVENOUS
  Filled 2020-12-20: qty 2

## 2020-12-20 MED ORDER — HYDROMORPHONE HCL 1 MG/ML IJ SOLN
0.5000 mg | Freq: Once | INTRAMUSCULAR | Status: AC
  Administered 2020-12-20: 0.5 mg via INTRAVENOUS
  Filled 2020-12-20 (×2): qty 1

## 2020-12-20 MED ORDER — MORPHINE SULFATE (PF) 4 MG/ML IV SOLN
4.0000 mg | Freq: Once | INTRAVENOUS | Status: AC
  Administered 2020-12-20: 4 mg via INTRAVENOUS
  Filled 2020-12-20: qty 1

## 2020-12-20 MED ORDER — IBUPROFEN 600 MG PO TABS: 600.0000 mg | ORAL_TABLET | Freq: Four times a day (QID) | ORAL | 0 refills | Status: DC | PRN

## 2020-12-20 MED ORDER — OXYCODONE-ACETAMINOPHEN 5-325 MG PO TABS: 1.0000 | ORAL_TABLET | Freq: Four times a day (QID) | ORAL | 0 refills | Status: DC | PRN

## 2020-12-20 MED ORDER — DIAZEPAM 5 MG PO TABS: 5.0000 mg | ORAL_TABLET | Freq: Two times a day (BID) | ORAL | 0 refills | Status: DC | PRN

## 2020-12-20 NOTE — ED Provider Notes (Signed)
[facility] EMERGENCY DEPARTMENT Provider Note   CSN: [id number] Arrival date & time: 12/20/20  1111     History Chief Complaint  Patient presents with   Fall   Shoulder Injury    Andrea Garrett is a 65 y.o. female.  Pt presents to the ED today with right shoulder pain after a fall.  The pt said she was walking on a trail and her foot caught an edge that was covered with leaves and she fell on her right side.  Pt did receive 200 mcg fentanyl en route.  No other injury.  She is not on blood thinners.      Past Medical History:  Diagnosis Date   Breast cancer ([place]) 09/20/2011   right, inv ductal ca, ER/PR +, HER 2 -   Hot flashes    No pertinent past medical history    Vitamin deficiency     Patient Active Problem List   Diagnosis Date Noted   Chest pain at rest 12/30/2014   Hypercholesteremia 12/30/2014   Cancer of upper-outer quadrant of female breast ([place]) 09/24/2011    Past Surgical History:  Procedure Laterality Date   BREAST BIOPSY  09/20/11   Right- inv ductal ca, DCIS   BREAST ENHANCEMENT SURGERY     Bilateral   BREAST LUMPECTOMY WITH NEEDLE LOCALIZATION AND AXILLARY SENTINEL LYMPH NODE BX  02/25/2012   Procedure: BREAST LUMPECTOMY WITH NEEDLE LOCALIZATION AND AXILLARY SENTINEL LYMPH NODE BX;  Surgeon: Edward Jolly, MD;  Location: [facility];  Service: General;  Laterality: Right;   CESAREAN SECTION     x2   DILATION AND CURETTAGE OF UTERUS  1988   TONSILLECTOMY       OB History     Gravida  3   Para  2   Term      Preterm      AB      Living         SAB      IAB      Ectopic      Multiple      Live Births           Obstetric Comments  Menarche age 71, G60, P59, menopause 2007, Premarin cream since 2007         Family History  Problem Relation Age of Onset   Breast cancer Maternal Aunt    Hypertension Mother    Hypertension Father    CAD Father 70       stent    Social History    Tobacco Use   Smoking status: Never   Smokeless tobacco: Never  Substance Use Topics   Alcohol use: No   Drug use: No    Home Medications Prior to Admission medications   Medication Sig Start Date End Date Taking? Authorizing Provider  diazepam (VALIUM) 5 MG tablet Take 1 tablet (5 mg total) by mouth every 12 (twelve) hours as needed for muscle spasms. 12/20/20  Yes Isla Pence, MD  ibuprofen (ADVIL) 600 MG tablet Take 1 tablet (600 mg total) by mouth every 6 (six) hours as needed. 12/20/20  Yes Isla Pence, MD  oxyCODONE-acetaminophen (PERCOCET/ROXICET) 5-325 MG tablet Take 1 tablet by mouth every 6 (six) hours as needed for severe pain. 12/20/20  Yes Isla Pence, MD  fish oil-omega-3 fatty acids 1000 MG capsule Take 2 g by mouth daily.    [provider]  Multiple Vitamins-Minerals (MULTIVITAMIN WITH MINERALS) tablet Take 1 tablet  by mouth daily.    [provider]  Probiotic Product (PRO-BIOTIC BLEND PO) Take 1 tablet by mouth.    [provider]    Allergies    Patient has no known allergies.  Review of Systems   Review of Systems  Musculoskeletal:        Right shoulder pain  All other systems reviewed and are negative.  Physical Exam Updated Vital Signs BP 102/70 (BP Location: Left Arm)   Pulse 64   Temp 98.4 F (36.9 C)   Resp 16   SpO2 100%   Physical Exam Vitals and nursing note reviewed.  Constitutional:      Appearance: Normal appearance.  HENT:     Head: Normocephalic and atraumatic.     Right Ear: External ear normal.     Left Ear: External ear normal.     Nose: Nose normal.     Mouth/Throat:     Mouth: Mucous membranes are moist.     Pharynx: Oropharynx is clear.  Eyes:     Extraocular Movements: Extraocular movements intact.     Conjunctiva/sclera: Conjunctivae normal.     Pupils: Pupils are equal, round, and reactive to light.  Cardiovascular:     Rate and Rhythm: Normal rate and regular rhythm.     Pulses:  Normal pulses.     Heart sounds: Normal heart sounds.  Pulmonary:     Effort: Pulmonary effort is normal.     Breath sounds: Normal breath sounds.  Abdominal:     General: Abdomen is flat. Bowel sounds are normal.     Palpations: Abdomen is soft.  Musculoskeletal:     Right shoulder: Deformity and tenderness present. Decreased range of motion.     Cervical back: Normal range of motion and neck supple.  Skin:    General: Skin is warm.     Capillary Refill: Capillary refill takes less than 2 seconds.  Neurological:     General: No focal deficit present.     Mental Status: She is alert and oriented to person, place, and time.  Psychiatric:        Mood and Affect: Mood normal.        Behavior: Behavior normal.    ED Results / Procedures / Treatments   Labs (all labs ordered are listed, but only abnormal results are displayed) Labs Reviewed - No data to display  EKG None  Radiology DG Humerus Right  Result Date: 12/20/2020 CLINICAL DATA:  Pain EXAM: RIGHT HUMERUS - 2+ VIEW COMPARISON:  None. FINDINGS: There is a comminuted impacted proximal humerus fracture involving the surgical neck and the greater tuberosity. There is mild angulation and displacement. IMPRESSION: Comminuted and impacted proximal humerus fracture involving the surgical neck and greater tuberosity with mild displacement and angulation. Electronically Signed   By: Maurine Simmering M.D.   On: 12/20/2020 12:10    Procedures Procedures   Medications Ordered in ED Medications  morphine 4 MG/ML injection 4 mg (4 mg Intravenous Given 12/20/20 1139)  ondansetron (ZOFRAN) injection 4 mg (4 mg Intravenous Given 12/20/20 1138)  sodium chloride 0.9 % bolus 1,000 mL (0 mLs Intravenous Stopped 12/20/20 1434)  HYDROmorphone (DILAUDID) injection 1 mg (1 mg Intravenous Given 12/20/20 1212)  LORazepam (ATIVAN) injection 1 mg (1 mg Intravenous Given 12/20/20 1232)  HYDROmorphone (DILAUDID) injection 0.5 mg (0.5 mg Intravenous Given  12/20/20 1450)    ED Course  I have reviewed the triage vital signs and the nursing notes.  Pertinent labs &  imaging results that were available during my care of the patient were reviewed by me and considered in my medical decision making (see chart for details).    MDM Rules/Calculators/A&P                           Pt does have a fx the surgical neck of her humerus.  She was d/w ortho.  They recommended a sling and f/u as an outpatient.  Pt's pain is tolerable after dilaudid.  She is able to ambulate without any other pain.  She is stable for d/c.  She is to return if worse.  F/u with ortho. Final Clinical Impression(s) / ED Diagnoses Final diagnoses:  Closed fracture of proximal end of right humerus, unspecified fracture morphology, initial encounter    Rx / DC Orders ED Discharge Orders          Ordered    oxyCODONE-acetaminophen (PERCOCET/ROXICET) 5-325 MG tablet  Every 6 hours PRN        12/20/20 1438    ibuprofen (ADVIL) 600 MG tablet  Every 6 hours PRN        12/20/20 1438    diazepam (VALIUM) 5 MG tablet  Every 12 hours PRN        12/20/20 1438             Isla Pence, MD 12/22/20 0730

## 2020-12-20 NOTE — ED Notes (Signed)
Pt ambulatory with minimal assistance from staff; endorsed feeling of "dizziness & blurred vision." Advised that symptoms likely r/t pain medication administration. Pt voiced understanding, voiced comfort walking otherwise. EDP aware

## 2020-12-20 NOTE — ED Notes (Signed)
Got patient on the monitor patient is resting with family at bedside 

## 2020-12-20 NOTE — Progress Notes (Signed)
Orthopedic Tech Progress Note Patient Details:  Andrea Garrett 1955/12/17 [id number]  Ortho Devices Type of Ortho Device: Shoulder immobilizer Ortho Device/Splint Location: RUE Ortho Device/Splint Interventions: Ordered, Adjustment, Application   Post Interventions Patient Tolerated: Well Instructions Provided: Care of device  [name] 12/20/2020, 2:47 PM

## 2020-12-20 NOTE — ED Triage Notes (Signed)
Patient BIB [facility]. Pt was walking on [place] trail, pt caught edge of side walk and fell onto right shoulder, pain at site and deformity. Received 200 mcg fentanyl via EMS. VSS. NAD. Denies LOC.

## 2020-12-22 ENCOUNTER — Other Ambulatory Visit: Payer: Self-pay | Admitting: Orthopedic Surgery

## 2020-12-22 DIAGNOSIS — S42231A 3-part fracture of surgical neck of right humerus, initial encounter for closed fracture: Secondary | ICD-10-CM

## 2020-12-26 ENCOUNTER — Ambulatory Visit
Admission: RE | Admit: 2020-12-26 | Discharge: 2020-12-26 | Disposition: A | Discharge status: Home / Self Care | Payer: Medicare Other | Source: Ambulatory Visit | Attending: Orthopedic Surgery | Admitting: Orthopedic Surgery

## 2020-12-26 DIAGNOSIS — S42231A 3-part fracture of surgical neck of right humerus, initial encounter for closed fracture: Secondary | ICD-10-CM

## 2020-12-28 ENCOUNTER — Encounter (HOSPITAL_COMMUNITY): Payer: Self-pay | Admitting: Orthopedic Surgery

## 2020-12-28 ENCOUNTER — Other Ambulatory Visit: Payer: Self-pay

## 2020-12-28 NOTE — Progress Notes (Signed)
PCP - Dr. Frutoso Chase  Cardiologist - Dr. Linard Millers  EP- Denies  Endocrine-Denies  Pulm-Denies  Chest x-ray - Denies  EKG - 11/03/20 (E)  Stress Test - Denies  ECHO - Denies  Cardiac Cath - Denies  AICD-na PM-na LOOP-na  Dialysis-Denies  Sleep Study - Denies CPAP - Denies  LABS- 12/30/20: CBC  ASA-Denies  ERAS- No  HA1C- Denies  Anesthesia- No  Pt denies having chest pain, sob, or fever during the pre-op phone call. All instructions explained to the pt, with a verbal understanding of the material including: As of today, stop taking all Aspirin (unless instructed by your doctor) and Other Aspirin containing products, Vitamins, Fish oils, and Herbal medications. Also stop all NSAIDS i.e. Advil, Ibuprofen, Motrin, Aleve, Anaprox, Naproxen, BC, Goody Powders, and all Supplements. Pt also instructed to wear a mask and social distance is she has to go out. The opportunity to ask questions was provided.    Coronavirus Screening  Have you experienced the following symptoms:  Cough yes/no: No Fever (>100.48F)  yes/no: No Runny nose yes/no: No Sore throat yes/no: No Difficulty breathing/shortness of breath  yes/no: No  Have you or a family member traveled in the last 14 days and where? yes/no: No   If the patient indicates "YES" to the above questions, their PAT will be rescheduled to limit the exposure to others and, the surgeon will be notified. THE PATIENT WILL NEED TO BE ASYMPTOMATIC FOR 14 DAYS.   If the patient is not experiencing any of these symptoms, the PAT nurse will instruct them to NOT bring anyone with them to their appointment since they may have these symptoms or traveled as well.   Please remind your patients and families that hospital visitation restrictions are in effect and the importance of the restrictions.

## 2020-12-29 NOTE — Anesthesia Preprocedure Evaluation (Addendum)
Anesthesia Evaluation  Patient identified by MRN, date of birth, ID band Patient awake    Reviewed: Allergy & Precautions, NPO status , Patient's Chart, lab work & pertinent test results  Airway Mallampati: II  TM Distance: >3 FB Neck ROM: Full    Dental  (+) Dental Advisory Given   Pulmonary neg pulmonary ROS,    Pulmonary exam normal breath sounds clear to auscultation       Cardiovascular negative cardio ROS Normal cardiovascular exam Rhythm:Regular Rate:Normal     Neuro/Psych negative neurological ROS     GI/Hepatic negative GI ROS, Neg liver ROS,   Endo/Other  negative endocrine ROS  Renal/GU negative Renal ROS     Musculoskeletal negative musculoskeletal ROS (+)   Abdominal   Peds  Hematology negative hematology ROS (+)   Anesthesia Other Findings   Reproductive/Obstetrics                            Anesthesia Physical Anesthesia Plan  ASA: 2  Anesthesia Plan: General   Post-op Pain Management:    Induction: Intravenous  PONV Risk Score and Plan: 3 and Ondansetron, Dexamethasone, Treatment may vary due to age or medical condition and Midazolam  Airway Management Planned: Oral ETT  Additional Equipment: None  Intra-op Plan:   Post-operative Plan: Extubation in OR  Informed Consent: I have reviewed the patients History and Physical, chart, labs and discussed the procedure including the risks, benefits and alternatives for the proposed anesthesia with the patient or authorized representative who has indicated his/her understanding and acceptance.     Dental advisory given  Plan Discussed with: CRNA  Anesthesia Plan Comments:        Anesthesia Quick Evaluation

## 2020-12-30 ENCOUNTER — Ambulatory Visit (HOSPITAL_COMMUNITY)
Admission: RE | Admit: 2020-12-30 | Discharge: 2020-12-30 | Disposition: A | Discharge status: Home / Self Care | Payer: Medicare Other | Attending: Orthopedic Surgery | Admitting: Orthopedic Surgery

## 2020-12-30 ENCOUNTER — Ambulatory Visit (HOSPITAL_COMMUNITY): Payer: Medicare Other

## 2020-12-30 ENCOUNTER — Ambulatory Visit (HOSPITAL_COMMUNITY): Payer: Medicare Other | Admitting: Anesthesiology

## 2020-12-30 ENCOUNTER — Encounter (HOSPITAL_COMMUNITY)
Admission: RE | Disposition: A | Discharge status: Home / Self Care | Payer: Self-pay | Source: Home / Self Care | Attending: Orthopedic Surgery

## 2020-12-30 DIAGNOSIS — Z419 Encounter for procedure for purposes other than remedying health state, unspecified: Secondary | ICD-10-CM

## 2020-12-30 DIAGNOSIS — S46111A Strain of muscle, fascia and tendon of long head of biceps, right arm, initial encounter: Secondary | ICD-10-CM | POA: Insufficient documentation

## 2020-12-30 DIAGNOSIS — W19XXXA Unspecified fall, initial encounter: Secondary | ICD-10-CM | POA: Diagnosis not present

## 2020-12-30 DIAGNOSIS — Z01818 Encounter for other preprocedural examination: Secondary | ICD-10-CM

## 2020-12-30 DIAGNOSIS — S42201A Unspecified fracture of upper end of right humerus, initial encounter for closed fracture: Secondary | ICD-10-CM | POA: Insufficient documentation

## 2020-12-30 DIAGNOSIS — T148XXA Other injury of unspecified body region, initial encounter: Secondary | ICD-10-CM

## 2020-12-30 DIAGNOSIS — S42291A Other displaced fracture of upper end of right humerus, initial encounter for closed fracture: Secondary | ICD-10-CM

## 2020-12-30 DIAGNOSIS — M810 Age-related osteoporosis without current pathological fracture: Secondary | ICD-10-CM

## 2020-12-30 HISTORY — PX: ORIF HUMERUS FRACTURE: SHX2126

## 2020-12-30 LAB — CBC WITH DIFFERENTIAL/PLATELET
Abs Immature Granulocytes: 0.01 10*3/uL (ref 0.00–0.07)
Basophils Absolute: 0 10*3/uL (ref 0.0–0.1)
Basophils Relative: 1 %
Eosinophils Absolute: 0.2 10*3/uL (ref 0.0–0.5)
Eosinophils Relative: 3 %
HCT: 41.4 % (ref 36.0–46.0)
Hemoglobin: 13.8 g/dL (ref 12.0–15.0)
Immature Granulocytes: 0 %
Lymphocytes Relative: 33 %
Lymphs Abs: 1.5 10*3/uL (ref 0.7–4.0)
MCH: 31.4 pg (ref 26.0–34.0)
MCHC: 33.3 g/dL (ref 30.0–36.0)
MCV: 94.1 fL (ref 80.0–100.0)
Monocytes Absolute: 0.7 10*3/uL (ref 0.1–1.0)
Monocytes Relative: 14 %
Neutro Abs: 2.3 10*3/uL (ref 1.7–7.7)
Neutrophils Relative %: 49 %
Platelets: 222 10*3/uL (ref 150–400)
RBC: 4.4 MIL/uL (ref 3.87–5.11)
RDW: 13.2 % (ref 11.5–15.5)
WBC: 4.7 10*3/uL (ref 4.0–10.5)
nRBC: 0 % (ref 0.0–0.2)

## 2020-12-30 LAB — COMPREHENSIVE METABOLIC PANEL
ALT: 23 U/L (ref 0–44)
AST: 26 U/L (ref 15–41)
Albumin: 3.2 g/dL — ABNORMAL LOW (ref 3.5–5.0)
Alkaline Phosphatase: 60 U/L (ref 38–126)
Anion gap: 7 (ref 5–15)
BUN: 13 mg/dL (ref 8–23)
CO2: 27 mmol/L (ref 22–32)
Calcium: 8.9 mg/dL (ref 8.9–10.3)
Chloride: 104 mmol/L (ref 98–111)
Creatinine, Ser: 0.73 mg/dL (ref 0.44–1.00)
GFR, Estimated: >60 mL/min (ref >60)
Glucose, Bld: 91 mg/dL (ref 70–99)
Potassium: 3.8 mmol/L (ref 3.5–5.1)
Sodium: 138 mmol/L (ref 135–145)
Total Bilirubin: 0.7 mg/dL (ref 0.3–1.2)
Total Protein: 6 g/dL — ABNORMAL LOW (ref 6.5–8.1)

## 2020-12-30 LAB — APTT: aPTT: 30 seconds (ref 24–36)

## 2020-12-30 LAB — VITAMIN D 25 HYDROXY (VIT D DEFICIENCY, FRACTURES): Vit D, 25-Hydroxy: 69.26 ng/mL (ref 30–100)

## 2020-12-30 LAB — PROTIME-INR
INR: 1 (ref 0.8–1.2)
Prothrombin Time: 13.2 seconds (ref 11.4–15.2)

## 2020-12-30 SURGERY — OPEN REDUCTION INTERNAL FIXATION (ORIF) PROXIMAL HUMERUS FRACTURE
Anesthesia: General | Site: Arm Upper | Laterality: Right

## 2020-12-30 MED ORDER — BUPIVACAINE HCL (PF) 0.5 % IJ SOLN
INTRAMUSCULAR | Status: DC | PRN
  Administered 2020-12-30: 10 mL via PERINEURAL

## 2020-12-30 MED ORDER — LIDOCAINE 2% (20 MG/ML) 5 ML SYRINGE
INTRAMUSCULAR | Status: AC
  Filled 2020-12-30: qty 5

## 2020-12-30 MED ORDER — ONDANSETRON HCL 4 MG/2ML IJ SOLN
INTRAMUSCULAR | Status: AC
  Filled 2020-12-30: qty 2

## 2020-12-30 MED ORDER — CEFAZOLIN SODIUM-DEXTROSE 2-4 GM/100ML-% IV SOLN
2.0000 g | INTRAVENOUS | Status: AC
  Administered 2020-12-30: 2 g via INTRAVENOUS
  Filled 2020-12-30: qty 100

## 2020-12-30 MED ORDER — PROPOFOL 10 MG/ML IV BOLUS
INTRAVENOUS | Status: AC
  Filled 2020-12-30: qty 20

## 2020-12-30 MED ORDER — LACTATED RINGERS IV SOLN: INTRAVENOUS | Status: DC | PRN

## 2020-12-30 MED ORDER — ONDANSETRON HCL 4 MG/2ML IJ SOLN
INTRAMUSCULAR | Status: DC | PRN
  Administered 2020-12-30: 4 mg via INTRAVENOUS

## 2020-12-30 MED ORDER — FENTANYL CITRATE (PF) 100 MCG/2ML IJ SOLN
INTRAMUSCULAR | Status: DC | PRN
  Administered 2020-12-30: 25 ug via INTRAVENOUS
  Administered 2020-12-30: 125 ug via INTRAVENOUS
  Administered 2020-12-30: 100 ug via INTRAVENOUS

## 2020-12-30 MED ORDER — PHENYLEPHRINE HCL-NACL 20-0.9 MG/250ML-% IV SOLN
INTRAVENOUS | Status: DC | PRN
  Administered 2020-12-30: 50 ug/min via INTRAVENOUS

## 2020-12-30 MED ORDER — PROPOFOL 10 MG/ML IV BOLUS
INTRAVENOUS | Status: DC | PRN
  Administered 2020-12-30: 100 mg via INTRAVENOUS

## 2020-12-30 MED ORDER — 0.9 % SODIUM CHLORIDE (POUR BTL) OPTIME
TOPICAL | Status: DC | PRN
  Administered 2020-12-30: 1000 mL

## 2020-12-30 MED ORDER — LIDOCAINE 2% (20 MG/ML) 5 ML SYRINGE
INTRAMUSCULAR | Status: DC | PRN
  Administered 2020-12-30: 50 mg via INTRAVENOUS

## 2020-12-30 MED ORDER — BUPIVACAINE LIPOSOME 1.3 % IJ SUSP
INTRAMUSCULAR | Status: DC | PRN
  Administered 2020-12-30: 10 mL via PERINEURAL

## 2020-12-30 MED ORDER — CHLORHEXIDINE GLUCONATE 4 % EX LIQD: 60.0000 mL | Freq: Once | CUTANEOUS | Status: DC

## 2020-12-30 MED ORDER — POVIDONE-IODINE 10 % EX SWAB
2.0000 "application " | Freq: Once | CUTANEOUS | Status: AC
  Administered 2020-12-30: 2 via TOPICAL

## 2020-12-30 MED ORDER — FENTANYL CITRATE (PF) 250 MCG/5ML IJ SOLN
INTRAMUSCULAR | Status: AC
  Filled 2020-12-30: qty 5

## 2020-12-30 MED ORDER — LACTATED RINGERS IV SOLN: INTRAVENOUS | Status: DC

## 2020-12-30 MED ORDER — ORAL CARE MOUTH RINSE: 15.0000 mL | Freq: Once | OROMUCOSAL | Status: AC

## 2020-12-30 MED ORDER — FENTANYL CITRATE (PF) 100 MCG/2ML IJ SOLN: 25.0000 ug | INTRAMUSCULAR | Status: DC | PRN

## 2020-12-30 MED ORDER — DEXAMETHASONE SODIUM PHOSPHATE 10 MG/ML IJ SOLN
INTRAMUSCULAR | Status: AC
  Filled 2020-12-30: qty 1

## 2020-12-30 MED ORDER — ONDANSETRON 4 MG PO TBDP: 4.0000 mg | ORAL_TABLET | Freq: Three times a day (TID) | ORAL | 0 refills | Status: AC | PRN

## 2020-12-30 MED ORDER — FENTANYL CITRATE (PF) 100 MCG/2ML IJ SOLN
INTRAMUSCULAR | Status: AC
  Filled 2020-12-30: qty 2

## 2020-12-30 MED ORDER — SUGAMMADEX SODIUM 200 MG/2ML IV SOLN
INTRAVENOUS | Status: DC | PRN
  Administered 2020-12-30: 200 mg via INTRAVENOUS

## 2020-12-30 MED ORDER — CHLORHEXIDINE GLUCONATE 0.12 % MT SOLN
15.0000 mL | Freq: Once | OROMUCOSAL | Status: AC
  Administered 2020-12-30: 15 mL via OROMUCOSAL
  Filled 2020-12-30: qty 15

## 2020-12-30 MED ORDER — ROCURONIUM BROMIDE 10 MG/ML (PF) SYRINGE
PREFILLED_SYRINGE | INTRAVENOUS | Status: DC | PRN
  Administered 2020-12-30: 50 mg via INTRAVENOUS

## 2020-12-30 MED ORDER — MEPERIDINE HCL 25 MG/ML IJ SOLN: 6.2500 mg | INTRAMUSCULAR | Status: DC | PRN

## 2020-12-30 MED ORDER — METHOCARBAMOL 500 MG PO TABS: 500.0000 mg | ORAL_TABLET | Freq: Three times a day (TID) | ORAL | 0 refills | Status: AC | PRN

## 2020-12-30 MED ORDER — OXYCODONE-ACETAMINOPHEN 5-325 MG PO TABS: 1.0000 | ORAL_TABLET | Freq: Four times a day (QID) | ORAL | 0 refills | Status: AC | PRN

## 2020-12-30 MED ORDER — ACETAMINOPHEN 500 MG PO TABS: 500.0000 mg | ORAL_TABLET | Freq: Two times a day (BID) | ORAL | 0 refills | Status: AC

## 2020-12-30 MED ORDER — PROMETHAZINE HCL 25 MG/ML IJ SOLN: 6.2500 mg | INTRAMUSCULAR | Status: DC | PRN

## 2020-12-30 MED ORDER — ROCURONIUM BROMIDE 10 MG/ML (PF) SYRINGE
PREFILLED_SYRINGE | INTRAVENOUS | Status: AC
  Filled 2020-12-30: qty 10

## 2020-12-30 MED ORDER — DEXAMETHASONE SODIUM PHOSPHATE 10 MG/ML IJ SOLN
INTRAMUSCULAR | Status: DC | PRN
  Administered 2020-12-30: 4 mg via INTRAVENOUS

## 2020-12-30 SURGICAL SUPPLY — 69 items
BAG COUNTER SPONGE SURGICOUNT (BAG) ×2 IMPLANT
BENZOIN TINCTURE PRP APPL 2/3 (GAUZE/BANDAGES/DRESSINGS) ×4 IMPLANT
BIT DRILL 3.2 (BIT) ×2
BIT DRILL 3.2XCALB NS DISP (BIT) ×1 IMPLANT
BIT DRILL CALIBRATED 2.7 (BIT) ×2 IMPLANT
BIT DRL 3.2XCALB NS DISP (BIT) ×1
BRUSH SCRUB EZ PLAIN DRY (MISCELLANEOUS) ×4 IMPLANT
COVER SURGICAL LIGHT HANDLE (MISCELLANEOUS) ×4 IMPLANT
DRAPE C-ARM 42X72 X-RAY (DRAPES) ×2 IMPLANT
DRAPE C-ARMOR (DRAPES) ×2 IMPLANT
DRAPE IMP U-DRAPE 54X76 (DRAPES) ×2 IMPLANT
DRAPE INCISE IOBAN 66X45 STRL (DRAPES) ×2 IMPLANT
DRAPE ORTHO SPLIT 77X108 STRL (DRAPES) ×4
DRAPE SURG 17X11 SM STRL (DRAPES) ×4 IMPLANT
DRAPE SURG ORHT 6 SPLT 77X108 (DRAPES) ×2 IMPLANT
DRAPE U-SHAPE 47X51 STRL (DRAPES) ×4 IMPLANT
DRSG ADAPTIC 3X8 NADH LF (GAUZE/BANDAGES/DRESSINGS) ×2 IMPLANT
DRSG MEPILEX BORDER 4X8 (GAUZE/BANDAGES/DRESSINGS) ×2 IMPLANT
DRSG PAD ABDOMINAL 8X10 ST (GAUZE/BANDAGES/DRESSINGS) ×2 IMPLANT
ELECT REM PT RETURN 9FT ADLT (ELECTROSURGICAL) ×2
ELECTRODE REM PT RTRN 9FT ADLT (ELECTROSURGICAL) ×1 IMPLANT
GLOVE SRG 8 PF TXTR STRL LF DI (GLOVE) ×1 IMPLANT
GLOVE SURG ENC MOIS LTX SZ7.5 (GLOVE) ×2 IMPLANT
GLOVE SURG ENC MOIS LTX SZ8 (GLOVE) ×2 IMPLANT
GLOVE SURG UNDER POLY LF SZ7.5 (GLOVE) ×2 IMPLANT
GLOVE SURG UNDER POLY LF SZ8 (GLOVE) ×2
GOWN STRL REUS W/ TWL LRG LVL3 (GOWN DISPOSABLE) ×2 IMPLANT
GOWN STRL REUS W/ TWL XL LVL3 (GOWN DISPOSABLE) ×1 IMPLANT
GOWN STRL REUS W/TWL LRG LVL3 (GOWN DISPOSABLE) ×4
GOWN STRL REUS W/TWL XL LVL3 (GOWN DISPOSABLE) ×2
K-WIRE 2X5 SS THRDED S3 (WIRE) ×6
KIT BASIN OR (CUSTOM PROCEDURE TRAY) ×2 IMPLANT
KIT TURNOVER KIT B (KITS) ×2 IMPLANT
KWIRE 2X5 SS THRDED S3 (WIRE) ×3 IMPLANT
NDL SUT 6 .5 CRC .975X.05 MAYO (NEEDLE) ×1 IMPLANT
NEEDLE MAYO TAPER (NEEDLE) ×2
NS IRRIG 1000ML POUR BTL (IV SOLUTION) ×2 IMPLANT
PACK TOTAL JOINT (CUSTOM PROCEDURE TRAY) ×2 IMPLANT
PAD ARMBOARD 7.5X6 YLW CONV (MISCELLANEOUS) ×4 IMPLANT
PEG LOCKING 3.2MMX44 (Peg) ×2 IMPLANT
PEG LOCKING 3.2MMX46 (Peg) ×4 IMPLANT
PEG LOCKING 3.2X40 (Peg) ×2 IMPLANT
PEG LOCKING 3.2X48 (Peg) ×2 IMPLANT
PEG LOCKING 3.2X58MM (Peg) ×2 IMPLANT
PLATE PROX HUM HI R 4H 90 (Plate) ×2 IMPLANT
SCREW LP NL T15 3.5X20 (Screw) ×2 IMPLANT
SCREW LP NL T15 3.5X22 (Screw) ×4 IMPLANT
SCREW LP NL T15 3.5X26 (Screw) ×2 IMPLANT
SLING ARM FOAM STRAP MED (SOFTGOODS) ×2 IMPLANT
SPONGE T-LAP 18X18 ~~LOC~~+RFID (SPONGE) ×2 IMPLANT
STAPLER VISISTAT 35W (STAPLE) ×2 IMPLANT
STOCKINETTE IMPERVIOUS LG (DRAPES) ×2 IMPLANT
SUCTION FRAZIER HANDLE 10FR (MISCELLANEOUS) ×2
SUCTION TUBE FRAZIER 10FR DISP (MISCELLANEOUS) ×1 IMPLANT
SUT ETHIBOND 5 LR DA (SUTURE) ×2 IMPLANT
SUT ETHILON 2 0 FS 18 (SUTURE) ×4 IMPLANT
SUT FIBERWIRE #2 38 T-5 BLUE (SUTURE) ×4
SUT PDS AB 2-0 CT1 27 (SUTURE) ×2 IMPLANT
SUT VIC AB 0 CT1 27 (SUTURE) ×4
SUT VIC AB 0 CT1 27XBRD ANBCTR (SUTURE) ×2 IMPLANT
SUT VIC AB 2-0 CT1 27 (SUTURE) ×4
SUT VIC AB 2-0 CT1 TAPERPNT 27 (SUTURE) ×2 IMPLANT
SUTURE FIBERWR #2 38 T-5 BLUE (SUTURE) ×2 IMPLANT
SYR 5ML LL (SYRINGE) IMPLANT
TOWEL GREEN STERILE (TOWEL DISPOSABLE) ×4 IMPLANT
TOWEL GREEN STERILE FF (TOWEL DISPOSABLE) ×2 IMPLANT
TRAY FOLEY MTR SLVR 16FR STAT (SET/KITS/TRAYS/PACK) IMPLANT
WATER STERILE IRR 1000ML POUR (IV SOLUTION) ×2 IMPLANT
YANKAUER SUCT BULB TIP NO VENT (SUCTIONS) IMPLANT

## 2020-12-30 NOTE — Anesthesia Procedure Notes (Signed)
Anesthesia Regional Block: Supraclavicular block   Pre-Anesthetic Checklist: , timeout performed,  Correct Patient, Correct Site, Correct Laterality,  Correct Procedure, Correct Position, site marked,  Risks and benefits discussed,  Surgical consent,  Pre-op evaluation,  At surgeon's request and post-op pain management  Laterality: Upper and Right  Prep: chloraprep       Needles:  Injection technique: Single-shot  Needle Type: Stimiplex          Additional Needles:   Procedures:,,,, ultrasound used (permanent image in chart),,    Narrative:  Start time: 12/30/2020 7:06 AM End time: 12/30/2020 7:27 AM Injection made incrementally with aspirations every 5 mL.  Events: blood aspirated,  Injection painful  Performed by: Personally  Anesthesiologist: Nolon Nations, MD  Additional Notes: BP cuff, SpO2 and EKG monitors applied. Sedation begun. Nerve location verified with ultrasound. Anesthetic injected incrementally, slowly, and after neg aspirations under direct u/s guidance. Good perineural spread. Tolerated well.

## 2020-12-30 NOTE — Anesthesia Procedure Notes (Signed)
Procedure Name: Intubation Date/Time: 12/30/2020 8:30 AM Performed by: Cathren Harsh, CRNA Pre-anesthesia Checklist: Patient identified, Emergency Drugs available, Suction available and Patient being monitored Patient Re-evaluated:Patient Re-evaluated prior to induction Oxygen Delivery Method: Circle System Utilized Preoxygenation: Pre-oxygenation with 100% oxygen Induction Type: IV induction Ventilation: Mask ventilation without difficulty Laryngoscope Size: Mac and 3 Grade View: Grade III Tube type: Oral Tube size: 7.0 mm Number of attempts: 1 Airway Equipment and Method: Stylet and Oral airway Placement Confirmation: ETT inserted through vocal cords under direct vision, positive ETCO2 and breath sounds checked- equal and bilateral Secured at: 21 cm Tube secured with: Tape Dental Injury: Teeth and Oropharynx as per pre-operative assessment

## 2020-12-30 NOTE — Op Note (Signed)
12/30/2020  11:17 AM  PATIENT:  Andrea Garrett  65 y.o. female  PRE-OPERATIVE DIAGNOSIS:  right proximal humerus fracture  POST-OPERATIVE DIAGNOSIS:   Right proximal humerus fracture Ruptured long head of the biceps tendon  PROCEDURE:  Procedure(s): OPEN REDUCTION INTERNAL FIXATION (ORIF) PROXIMAL HUMERUS FRACTURE (Right) 3 PART 2. TENODESIS OF BICEPS TENDON, LONG HEAD  SURGEON:  Surgeon(s) and Role:    Altamese Buffalo, MD - Primary  PHYSICIAN ASSISTANT: Ainsley Spinner, PA-C  ANESTHESIA:   general and regional block  I/O:  Total I/O In: 600 [I.V.:600] Out: 65 [Blood:65]  SPECIMEN:  No Specimen  TOURNIQUET:  * No tourniquets in log *  COMPLICATIONS: NONE  DICTATION: Note written in EPIC  DISPOSITION: TO PACU  CONDITION: STABLE  DELAY START OF DVT PROPHYLAXIS BECAUSE OF BLEEDING RISK: NO  BRIEF SUMMARY OF INDICATION FOR PROCEDURE:  Andrea Garrett is a very pleasant 65 y.o. right-hand dominant who fell walking and sustained a left proximal humerus fracture. In order to most reliably restore function and reduce pain I recommended surgical repair.  I discussed risks of heart attack, stroke, infection, malunion, nonunion, loss of motion, DVT/ PE, loss of reduction, avascular necrosis, screw penetration, and need for further surgery, among others. Consent was provided to proceed.  BRIEF SUMMARY OF PROCEDURE:  After preoperative antibiotics, the patient was taken to the operating room where general anesthesia was induced. The standard deltopectoral approach was made after time-out.  Dissection was carried down to the interval where the superior lateral edge of the pec insertion was mobilized as well as the more medial anterior edge of the deltoid.  The tendon had been ruptured at the apex of the fracture site and retracted almost completely out of the groove. I was able to mobilize the head segment with k-wires to restore appropriate neck shaft angle and also to identify the  greater and lesser tuberosity segments. Hematoma was evacuated with curettes and lavage. The tuberosities were secured with Sandi Raveling sutures for later repair to the plate. Using the long head of the biceps and the bicipital groove as landmarks, reduction maneuvers were performed while keeping the periostial attachments to all the fragments.  Pins were used provisionally followed by the plate with additional pins for further fixation.  C-arm confirmed appropriate alignment and reduction with goals for restoration of proper head shaft orientation and tuberosity position.  This was followed by a screw fixation into the shaft and then careful placement of a large tenaculum then peg fixation into the head, which worked to appose the plate to the bone, although it was not completely flush in all areas. The reduction of the humeral head and shaft was excellent and so we proceeded with additional fixation, including suture fixation of the tuberosities. Locked pegs were placed into the head and standard screws into the shaft.  Final images showed appropriate reduction, hardware trajectory, and length. Outstanding clinical motion was feasible on the table, including abduction, and internal and external rotation. The biceps tendon was tenodesed with #2 fiberwire suture directly into the distal aspect of the groove. My assistant, Ainsley Spinner, was present and assisting throughout.  An assistant was absolutely necessary for safe and effective completion as the reduction had to be held during provisional and definitive fixation.  The wound was thoroughly irrigated and then closed in a standard layered fashion using #1 Vicryl to reapproximate the superior edge of the pec and anterior edge of the delt and then 0 for reapproximation of the muscular  interval, 2-0 Vicryl and nylon for the skin.  Sterile gently compressive dressing was applied and a sling with an Ace from hand to upper arm.  The patient was taken to PACU  in stable condition.  PROGNOSIS:  Andrea Garrett will have unrestricted gentle passive range of motion of the operative shoulder at this time with active assisted elbow flexion, active wrist, and digital motion.  Follow up in 10-14 days for removal of sutures.     [name]. Marcelino Scot, M.D.

## 2020-12-30 NOTE — Discharge Instructions (Addendum)
[facility]uma [facility] Discharge Instructions   General Discharge Instructions  Orthopaedic Injuries:  Right proximal humerus fracture treated with open reduction and internal fixation using plate and screws   WEIGHT BEARING STATUS: Nonweightbearing Right arm. No lifting with right arm   RANGE OF MOTION/ACTIVITY: shoulder pendulums ok in sling. Ok to come of sling several times a day when seated or in bed to move elbow, forearm, wrist and hand.    Bone health: vitamin d levels look good  Review the following resource for additional information regarding bone health  [email or web address]  Wound Care: daily wound care starting on 01/01/2021. See below  Discharge Wound Care Instructions  Do NOT apply any ointments, solutions or lotions to pin sites or surgical wounds.  These prevent needed drainage and even though solutions like hydrogen peroxide kill bacteria, they also damage cells lining the pin sites that help fight infection.  Applying lotions or ointments can keep the wounds moist and can cause them to breakdown and open up as well. This can increase the risk for infection. When in doubt call the office.  Surgical incisions should be dressed daily.  If any drainage is noted, use one layer of adaptic or Mepitel, then gauze, and tape. Alternatively you can use a mepilex type dressing which is the beige dressing you have on now, it is a silicone foam dressing  [email or web address]  These dressing supplies should be available at local medical supply stores ([facility], [facility], etc). They are not usually carried at places like [facility], [facility], [facility], etc  Once the incision is completely dry and without drainage, it may be left  open to air out.  Showering may begin 36-48 hours later.  Cleaning gently with soap and water.   Diet: as you were eating previously.  Can use over the counter stool softeners and bowel preparations, such as Miralax, to help with bowel movements.  Narcotics can be constipating.  Be sure to drink plenty of fluids  PAIN MEDICATION USE AND EXPECTATIONS  You have likely been given narcotic medications to help control your pain.  After a traumatic event that results in an fracture (broken bone) with or without surgery, it is ok to use narcotic pain medications to help control one's pain.  We understand that everyone responds to pain differently and each individual patient will be evaluated on a regular basis for the continued need for narcotic medications. Ideally, narcotic medication use should last no more than 6-8 weeks (coinciding with fracture healing).   As a patient it is your responsibility as well to monitor narcotic medication use and report the amount and frequency you use these medications when you come to your office visit.   We would also advise that if you are using narcotic medications, you should take a dose prior to therapy to maximize you participation.  IF YOU ARE ON NARCOTIC MEDICATIONS IT IS NOT PERMISSIBLE TO OPERATE A MOTOR VEHICLE (MOTORCYCLE/CAR/TRUCK/MOPED) OR HEAVY MACHINERY DO NOT MIX NARCOTICS WITH OTHER CNS (CENTRAL NERVOUS SYSTEM) DEPRESSANTS SUCH AS ALCOHOL   POST-OPERATIVE OPIOID TAPER INSTRUCTIONS: It is important to wean off of your opioid medication as soon as possible. If you do not need pain medication after your surgery it is ok to stop day one. Opioids include: Codeine, Hydrocodone(Norco, Vicodin), Oxycodone(Percocet, oxycontin) and hydromorphone amongst others.  Long term and even short term use of opiods can cause: Increased pain response Dependence Constipation Depression Respiratory depression And more.  Withdrawal symptoms can include Flu like  symptoms Nausea, vomiting And more Techniques to manage these symptoms Hydrate well Eat regular healthy meals Stay active Use relaxation techniques(deep breathing, meditating, yoga) Do Not substitute Alcohol to help with tapering If you have been on opioids for less than two weeks and do not have pain than it is ok to stop all together.  Plan to wean off of opioids This plan should start within one week post op of your fracture surgery  Maintain the same interval or time between taking each dose and first decrease the dose.  Cut the total daily intake of opioids by one tablet each day Next start to increase the time between doses. The last dose that should be eliminated is the evening dose.    STOP SMOKING OR USING NICOTINE PRODUCTS!!!!  As discussed nicotine severely impairs your body's ability to heal surgical and traumatic wounds but also impairs bone healing.  Wounds and bone heal by forming microscopic blood vessels (angiogenesis) and nicotine is a vasoconstrictor (essentially, shrinks blood vessels).  Therefore, if vasoconstriction occurs to these microscopic blood vessels they essentially disappear and are unable to deliver necessary nutrients to the healing tissue.  This is one modifiable factor that you can do to dramatically increase your chances of healing your injury.    (This means no smoking, no nicotine gum, patches, etc)  DO NOT USE NONSTEROIDAL ANTI-INFLAMMATORY DRUGS (NSAID'S)  Using products such as Advil (ibuprofen), Aleve (naproxen), Motrin (ibuprofen) for additional pain control during fracture healing can delay and/or prevent the healing response.  If you would like to take over the counter (OTC) medication, Tylenol (acetaminophen) is ok.  However, some narcotic medications that are given for pain control contain acetaminophen as well. Therefore, you should not exceed more than 4000 mg of tylenol in a day if you do not have liver disease.  Also note that there are may  OTC medicines, such as cold medicines and allergy medicines that my contain tylenol as well.  If you have any questions about medications and/or interactions please ask your doctor/PA or your pharmacist.      ICE AND ELEVATE INJURED/OPERATIVE EXTREMITY  Using ice and elevating the injured extremity above your heart can help with swelling and pain control.  Icing in a pulsatile fashion, such as 20 minutes on and 20 minutes off, can be followed.    Do not place ice directly on skin. Make sure there is a barrier between to skin and the ice pack.    Using frozen items such as frozen peas works well as the conform nicely to the are that needs to be iced.  USE AN ACE WRAP OR TED HOSE FOR SWELLING CONTROL  In addition to icing and elevation, Ace wraps or TED hose are used to help limit and resolve swelling.  It is recommended to use Ace wraps or TED hose until you are informed to stop.    When using Ace Wraps start the wrapping distally (farthest away from the body) and wrap proximally (closer to the body)   Example: If you had surgery on your leg or thing and you do not have a splint on, start the ace wrap at the toes and work your way up to the thigh        If you had surgery on your upper extremity and do not have a splint on, start the ace wrap at your fingers and work your way up to the upper arm  IF YOU ARE IN A SPLINT OR CAST DO  NOT REMOVE IT FOR ANY REASON   If your splint gets wet for any reason please contact the office immediately. You may shower in your splint or cast as long as you keep it dry.  This can be done by wrapping in a cast cover or garbage back (or similar)  Do Not stick any thing down your splint or cast such as pencils, money, or hangers to try and scratch yourself with.  If you feel itchy take benadryl as prescribed on the bottle for itching  IF YOU ARE IN A CAM BOOT (BLACK BOOT)  You may remove boot periodically. Perform daily dressing changes as noted below.  Wash the liner  of the boot regularly and wear a sock when wearing the boot. It is recommended that you sleep in the boot until told otherwise    Call office for the following: Temperature greater than 101F Persistent nausea and vomiting Severe uncontrolled pain Redness, tenderness, or signs of infection (pain, swelling, redness, odor or green/yellow discharge around the site) Difficulty breathing, headache or visual disturbances Hives Persistent dizziness or light-headedness Extreme fatigue Any other questions or concerns you may have after discharge  In an emergency, call 911 or go to an Emergency Department at a nearby hospital  HELPFUL INFORMATION  If you had a block, it will wear off between 8-24 hrs postop typically.  This is period when your pain may go from nearly zero to the pain you would have had postop without the block.  This is an abrupt transition but nothing dangerous is happening.  You may take an extra dose of narcotic when this happens.  You should wean off your narcotic medicines as soon as you are able.  Most patients will be off or using minimal narcotics before their first postop appointment.   We suggest you use the pain medication the first night prior to going to bed, in order to ease any pain when the anesthesia wears off. You should avoid taking pain medications on an empty stomach as it will make you nauseous.  Do not drink alcoholic beverages or take illicit drugs when taking pain medications.  In most states it is against the law to drive while you are in a splint or sling.  And certainly against the law to drive while taking narcotics.  You may return to work/school in the next couple of days when you feel up to it.   Pain medication may make you constipated.  Below are a few solutions to try in this order: Decrease the amount of pain medication if you aren't having pain. Drink lots of decaffeinated fluids. Drink prune juice and/or each dried prunes  If the first 3  don't work start with additional solutions Take Colace - an over-the-counter stool softener Take Senokot - an over-the-counter laxative Take Miralax - a stronger over-the-counter laxative     CALL THE OFFICE WITH ANY QUESTIONS OR CONCERNS: [phone]   VISIT OUR WEBSITE FOR ADDITIONAL INFORMATION: [email or web address]

## 2020-12-30 NOTE — H&P (Addendum)
Orthopaedic Trauma Service H&P  Patient ID: Andrea Garrett MRN: [id number] DOB/AGE: January 07, 1956 64 y.o.  Chief Complaint: Right proximal humerus fracture HPI: Andrea Garrett is an 65 y.o. female.RHD with  fall on the right shoulder and 3 part proximal humerus fracture in varus.  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/2011   right, inv ductal ca, ER/PR +, HER 2 -   Hot flashes    No pertinent past medical history    Vitamin deficiency     Past Surgical History:  Procedure Laterality Date   BREAST BIOPSY  09/20/2011   Right- inv ductal ca, DCIS   BREAST ENHANCEMENT SURGERY     Bilateral   BREAST LUMPECTOMY WITH NEEDLE LOCALIZATION AND AXILLARY SENTINEL LYMPH NODE BX  02/25/2012   Procedure: BREAST LUMPECTOMY WITH NEEDLE LOCALIZATION AND AXILLARY SENTINEL LYMPH NODE BX;  Surgeon: Edward Jolly, MD;  Location: [facility];  Service: General;  Laterality: Right;   CESAREAN SECTION     x2   DILATION AND CURETTAGE OF UTERUS  02/12/1986   TONSILLECTOMY     TUBAL LIGATION      Family History  Problem Relation Age of Onset   Breast cancer Maternal Aunt    Hypertension Mother    Hypertension Father    CAD Father 30       stent   Social History:  reports that she has never smoked. She has never used smokeless tobacco. She reports that she does not drink alcohol and does not use drugs.  Allergies: No Known Allergies  Medications Prior to Admission  Medication Sig Dispense Refill   CALCIUM CITRATE PO Take 1 tablet by mouth in the morning and at bedtime.     cyclobenzaprine (FLEXERIL) 10 MG tablet Take 10 mg by mouth 3 (three) times daily as needed for muscle spasms.     ibuprofen (ADVIL) 200 MG tablet Take 200-400 mg by mouth 2 (two) times daily as needed (pain.).     oxyCODONE-acetaminophen (PERCOCET/ROXICET) 5-325 MG tablet Take 1 tablet by mouth every 6 (six) hours as needed for severe pain. 15 tablet 0    polyethylene glycol (MIRALAX / GLYCOLAX) 17 g packet Take 17 g by mouth in the morning.     Vitamin D-Vitamin K (VITAMIN K2-VITAMIN D3 PO) Take 1 tablet by mouth daily in the afternoon.     diazepam (VALIUM) 5 MG tablet Take 1 tablet (5 mg total) by mouth every 12 (twelve) hours as needed for muscle spasms. (Patient not taking: No sig reported) 10 tablet 0   fish oil-omega-3 fatty acids 1000 MG capsule Take 2 g by mouth daily.     ibuprofen (ADVIL) 600 MG tablet Take 1 tablet (600 mg total) by mouth every 6 (six) hours as needed. (Patient not taking: No sig reported) 30 tablet 0    Results for orders placed or performed during the hospital encounter of 12/30/20 (from the past 48 hour(s))  CBC WITH DIFFERENTIAL     Status: None   Collection Time: 12/30/20  5:46 AM  Result Value Ref Range   WBC 4.7 4.0 - 10.5 K/uL   RBC 4.40 3.87 - 5.11 MIL/uL   Hemoglobin 13.8 12.0 - 15.0 g/dL   HCT 41.4 36.0 - 46.0 %   MCV 94.1 80.0 - 100.0 fL   MCH 31.4 26.0 - 34.0 pg   MCHC 33.3 30.0 - 36.0 g/dL   RDW 13.2 11.5 - 15.5 %  Platelets 222 150 - 400 K/uL   nRBC 0.0 0.0 - 0.2 %   Neutrophils Relative % 49 %   Neutro Abs 2.3 1.7 - 7.7 K/uL   Lymphocytes Relative 33 %   Lymphs Abs 1.5 0.7 - 4.0 K/uL   Monocytes Relative 14 %   Monocytes Absolute 0.7 0.1 - 1.0 K/uL   Eosinophils Relative 3 %   Eosinophils Absolute 0.2 0.0 - 0.5 K/uL   Basophils Relative 1 %   Basophils Absolute 0.0 0.0 - 0.1 K/uL   Immature Granulocytes 0 %   Abs Immature Granulocytes 0.01 0.00 - 0.07 K/uL    Comment: Performed at [facility] [address], [place], [place] [address]  Protime-INR     Status: None   Collection Time: 12/30/20  5:46 AM  Result Value Ref Range   Prothrombin Time 13.2 11.4 - 15.2 seconds   INR 1.0 0.8 - 1.2    Comment: (NOTE) INR goal varies based on device and disease states. Performed at [facility], [place] [address], [place], [place] [address]   APTT     Status: None   Collection  Time: 12/30/20  5:46 AM  Result Value Ref Range   aPTT 30 24 - 36 seconds    Comment: Performed at [facility] [address] [place], [place] [address]   No results found.  ROS No recent fever, bleeding abnormalities, urologic dysfunction, GI problems, or weight gain.   Blood pressure 133/73, pulse 60, temperature 97.7 F (36.5 C), temperature source Oral, resp. rate 18, height 5\' 5"  (1.651 m), weight 52.2 kg, SpO2 98 %. Physical Exam NCAT RUEx   Tenderness and mild ecchymosis shoulder  Sens  Ax/R/M/U intact  Mot   Ax/ R/ PIN/ M/ AIN/ U intact  Brisk CR  Some tenderness elbow and forearm also but no block to motion or crepitus   Assessment/Plan  Right proximal humerus  I discussed with the patient the risks and benefits of surgery, including the possibility of infection, nerve injury, vessel injury, wound breakdown, arthritis, symptomatic hardware, avascular necrosis, screw  penetration, DVT/ PE, loss of motion, malunion, nonunion, and need for further surgery among others.  She acknowledged these risks and wished to proceed.  [address], MD [facility], [facility] [phone]  12/30/2020, 8:07 AM  [facility] [place] [address] [phone] Domingo Sep (F)

## 2020-12-30 NOTE — Transfer of Care (Signed)
Immediate Anesthesia Transfer of Care Note  Patient: Andrea Garrett  Procedure(s) Performed: OPEN REDUCTION INTERNAL FIXATION (ORIF) PROXIMAL HUMERUS FRACTURE (Right: Arm Upper)  Patient Location: PACU  Anesthesia Type:General and Regional  Level of Consciousness: drowsy, patient cooperative and responds to stimulation  Airway & Oxygen Therapy: Patient Spontanous Breathing and Patient connected to nasal cannula oxygen  Post-op Assessment: Report given to RN and Post -op Vital signs reviewed and stable  Post vital signs: Reviewed and stable  Last Vitals:  Vitals Value Taken Time  BP 106/59 12/30/20 1042  Temp    Pulse 92 12/30/20 1044  Resp 13 12/30/20 1044  SpO2 100 % 12/30/20 1044  Vitals shown include unvalidated device data.  Last Pain:  Vitals:   12/30/20 0631  TempSrc: Oral  PainSc:       Patients Stated Pain Goal: 2 (16/94/50 3888)  Complications: No notable events documented.

## 2021-01-01 NOTE — Anesthesia Postprocedure Evaluation (Signed)
Anesthesia Post Note  Patient: Andrea Garrett  Procedure(s) Performed: OPEN REDUCTION INTERNAL FIXATION (ORIF) PROXIMAL HUMERUS FRACTURE (Right: Arm Upper)     Patient location during evaluation: PACU Anesthesia Type: General Level of consciousness: sedated and patient cooperative Pain management: pain level controlled Vital Signs Assessment: post-procedure vital signs reviewed and stable Respiratory status: spontaneous breathing Cardiovascular status: stable Anesthetic complications: no   No notable events documented.  Last Vitals:  Vitals:   12/30/20 1057 12/30/20 1112  BP: 107/62 114/72  Pulse: 96 94  Resp: 16 14  Temp:  36.5 C  SpO2: 96% 97%    Last Pain:  Vitals:   12/30/20 1112  TempSrc:   PainSc: 0-No pain                 Nolon Nations

## 2021-01-02 ENCOUNTER — Encounter (HOSPITAL_COMMUNITY): Payer: Self-pay | Admitting: Orthopedic Surgery

## 2021-01-09 ENCOUNTER — Telehealth (HOSPITAL_COMMUNITY): Payer: Self-pay | Admitting: Interventional Cardiology

## 2021-01-09 NOTE — Telephone Encounter (Signed)
Patient husband called and cancelled My[facility] for reason below:  01/04/2021 11:02 AM By:LAX, PAMELA M  Cancel Rsn: Patient (pt had accident and have [place] surgery husband will call back to resch both appts a little later)  Order will be removed from the active [place] and when pt calls back to reschedule we  will reinstate the order.

## 2021-01-12 ENCOUNTER — Other Ambulatory Visit: Payer: Medicare Other

## 2021-01-18 ENCOUNTER — Encounter (HOSPITAL_COMMUNITY): Payer: Self-pay

## 2021-01-18 ENCOUNTER — Encounter (HOSPITAL_COMMUNITY): Payer: Medicare Other

## 2021-01-18 ENCOUNTER — Other Ambulatory Visit: Payer: Self-pay

## 2021-01-20 ENCOUNTER — Other Ambulatory Visit: Payer: Self-pay | Admitting: Orthopedic Surgery

## 2021-01-20 DIAGNOSIS — M810 Age-related osteoporosis without current pathological fracture: Secondary | ICD-10-CM

## 2021-01-25 ENCOUNTER — Ambulatory Visit
Admission: RE | Admit: 2021-01-25 | Discharge: 2021-01-25 | Disposition: A | Discharge status: Home / Self Care | Payer: Medicare Other | Source: Ambulatory Visit | Attending: Orthopedic Surgery | Admitting: Orthopedic Surgery

## 2021-01-25 DIAGNOSIS — M810 Age-related osteoporosis without current pathological fracture: Secondary | ICD-10-CM

## 2021-02-17 DIAGNOSIS — M25511 Pain in right shoulder: Secondary | ICD-10-CM | POA: Diagnosis not present

## 2021-02-17 DIAGNOSIS — M6281 Muscle weakness (generalized): Secondary | ICD-10-CM | POA: Diagnosis not present

## 2021-02-22 DIAGNOSIS — M25511 Pain in right shoulder: Secondary | ICD-10-CM | POA: Diagnosis not present

## 2021-02-22 DIAGNOSIS — M6281 Muscle weakness (generalized): Secondary | ICD-10-CM | POA: Diagnosis not present

## 2021-03-03 DIAGNOSIS — M6281 Muscle weakness (generalized): Secondary | ICD-10-CM | POA: Diagnosis not present

## 2021-03-03 DIAGNOSIS — M25511 Pain in right shoulder: Secondary | ICD-10-CM | POA: Diagnosis not present

## 2021-03-08 DIAGNOSIS — S42231D 3-part fracture of surgical neck of right humerus, subsequent encounter for fracture with routine healing: Secondary | ICD-10-CM | POA: Diagnosis not present

## 2021-03-08 DIAGNOSIS — M25531 Pain in right wrist: Secondary | ICD-10-CM | POA: Diagnosis not present

## 2021-03-08 DIAGNOSIS — M25332 Other instability, left wrist: Secondary | ICD-10-CM | POA: Diagnosis not present

## 2021-03-08 DIAGNOSIS — M25511 Pain in right shoulder: Secondary | ICD-10-CM | POA: Diagnosis not present

## 2021-03-08 DIAGNOSIS — M6281 Muscle weakness (generalized): Secondary | ICD-10-CM | POA: Diagnosis not present

## 2021-03-15 DIAGNOSIS — M25511 Pain in right shoulder: Secondary | ICD-10-CM | POA: Diagnosis not present

## 2021-03-15 DIAGNOSIS — M6281 Muscle weakness (generalized): Secondary | ICD-10-CM | POA: Diagnosis not present

## 2021-03-16 DIAGNOSIS — M810 Age-related osteoporosis without current pathological fracture: Secondary | ICD-10-CM | POA: Diagnosis not present

## 2021-03-17 DIAGNOSIS — M6281 Muscle weakness (generalized): Secondary | ICD-10-CM | POA: Diagnosis not present

## 2021-03-17 DIAGNOSIS — M25511 Pain in right shoulder: Secondary | ICD-10-CM | POA: Diagnosis not present

## 2021-03-22 DIAGNOSIS — M6281 Muscle weakness (generalized): Secondary | ICD-10-CM | POA: Diagnosis not present

## 2021-03-22 DIAGNOSIS — M25511 Pain in right shoulder: Secondary | ICD-10-CM | POA: Diagnosis not present

## 2021-03-24 DIAGNOSIS — Z0000 Encounter for general adult medical examination without abnormal findings: Secondary | ICD-10-CM | POA: Diagnosis not present

## 2021-03-24 DIAGNOSIS — M25511 Pain in right shoulder: Secondary | ICD-10-CM | POA: Diagnosis not present

## 2021-03-24 DIAGNOSIS — Z13220 Encounter for screening for lipoid disorders: Secondary | ICD-10-CM | POA: Diagnosis not present

## 2021-03-24 DIAGNOSIS — Z79899 Other long term (current) drug therapy: Secondary | ICD-10-CM | POA: Diagnosis not present

## 2021-03-24 DIAGNOSIS — Z148 Genetic carrier of other disease: Secondary | ICD-10-CM | POA: Diagnosis not present

## 2021-03-24 DIAGNOSIS — M6281 Muscle weakness (generalized): Secondary | ICD-10-CM | POA: Diagnosis not present

## 2021-03-24 DIAGNOSIS — M810 Age-related osteoporosis without current pathological fracture: Secondary | ICD-10-CM | POA: Diagnosis not present

## 2021-03-27 DIAGNOSIS — Z136 Encounter for screening for cardiovascular disorders: Secondary | ICD-10-CM | POA: Diagnosis not present

## 2021-03-27 DIAGNOSIS — Z79899 Other long term (current) drug therapy: Secondary | ICD-10-CM | POA: Diagnosis not present

## 2021-03-27 DIAGNOSIS — Z148 Genetic carrier of other disease: Secondary | ICD-10-CM | POA: Diagnosis not present

## 2021-03-27 DIAGNOSIS — Z13220 Encounter for screening for lipoid disorders: Secondary | ICD-10-CM | POA: Diagnosis not present

## 2021-03-29 DIAGNOSIS — M25511 Pain in right shoulder: Secondary | ICD-10-CM | POA: Diagnosis not present

## 2021-03-29 DIAGNOSIS — M6281 Muscle weakness (generalized): Secondary | ICD-10-CM | POA: Diagnosis not present

## 2021-03-31 DIAGNOSIS — M25511 Pain in right shoulder: Secondary | ICD-10-CM | POA: Diagnosis not present

## 2021-03-31 DIAGNOSIS — M6281 Muscle weakness (generalized): Secondary | ICD-10-CM | POA: Diagnosis not present

## 2021-04-05 DIAGNOSIS — M25511 Pain in right shoulder: Secondary | ICD-10-CM | POA: Diagnosis not present

## 2021-04-05 DIAGNOSIS — S42231D 3-part fracture of surgical neck of right humerus, subsequent encounter for fracture with routine healing: Secondary | ICD-10-CM | POA: Diagnosis not present

## 2021-04-05 DIAGNOSIS — M6281 Muscle weakness (generalized): Secondary | ICD-10-CM | POA: Diagnosis not present

## 2021-04-06 DIAGNOSIS — M810 Age-related osteoporosis without current pathological fracture: Secondary | ICD-10-CM | POA: Diagnosis not present

## 2021-04-07 DIAGNOSIS — M6281 Muscle weakness (generalized): Secondary | ICD-10-CM | POA: Diagnosis not present

## 2021-04-07 DIAGNOSIS — M25511 Pain in right shoulder: Secondary | ICD-10-CM | POA: Diagnosis not present

## 2021-04-12 DIAGNOSIS — S42231D 3-part fracture of surgical neck of right humerus, subsequent encounter for fracture with routine healing: Secondary | ICD-10-CM | POA: Diagnosis not present

## 2021-04-12 DIAGNOSIS — M6281 Muscle weakness (generalized): Secondary | ICD-10-CM | POA: Diagnosis not present

## 2021-04-12 DIAGNOSIS — M25511 Pain in right shoulder: Secondary | ICD-10-CM | POA: Diagnosis not present

## 2021-04-14 DIAGNOSIS — D72819 Decreased white blood cell count, unspecified: Secondary | ICD-10-CM | POA: Diagnosis not present

## 2021-04-14 DIAGNOSIS — M25511 Pain in right shoulder: Secondary | ICD-10-CM | POA: Diagnosis not present

## 2021-04-14 DIAGNOSIS — M6281 Muscle weakness (generalized): Secondary | ICD-10-CM | POA: Diagnosis not present

## 2021-04-19 DIAGNOSIS — M25511 Pain in right shoulder: Secondary | ICD-10-CM | POA: Diagnosis not present

## 2021-04-19 DIAGNOSIS — M6281 Muscle weakness (generalized): Secondary | ICD-10-CM | POA: Diagnosis not present

## 2021-04-21 DIAGNOSIS — M6281 Muscle weakness (generalized): Secondary | ICD-10-CM | POA: Diagnosis not present

## 2021-04-21 DIAGNOSIS — M25511 Pain in right shoulder: Secondary | ICD-10-CM | POA: Diagnosis not present

## 2021-04-26 DIAGNOSIS — M6281 Muscle weakness (generalized): Secondary | ICD-10-CM | POA: Diagnosis not present

## 2021-04-26 DIAGNOSIS — M25511 Pain in right shoulder: Secondary | ICD-10-CM | POA: Diagnosis not present

## 2021-04-28 DIAGNOSIS — M25511 Pain in right shoulder: Secondary | ICD-10-CM | POA: Diagnosis not present

## 2021-04-28 DIAGNOSIS — M6281 Muscle weakness (generalized): Secondary | ICD-10-CM | POA: Diagnosis not present

## 2021-05-02 ENCOUNTER — Other Ambulatory Visit: Payer: Self-pay | Admitting: *Deleted

## 2021-05-02 DIAGNOSIS — R7989 Other specified abnormal findings of blood chemistry: Secondary | ICD-10-CM

## 2021-05-03 ENCOUNTER — Inpatient Hospital Stay: Payer: Medicare Other | Attending: Nurse Practitioner

## 2021-05-03 ENCOUNTER — Other Ambulatory Visit: Payer: Self-pay

## 2021-05-03 ENCOUNTER — Encounter: Payer: Self-pay | Admitting: Nurse Practitioner

## 2021-05-03 ENCOUNTER — Inpatient Hospital Stay (HOSPITAL_BASED_OUTPATIENT_CLINIC_OR_DEPARTMENT_OTHER): Payer: Medicare Other | Admitting: Nurse Practitioner

## 2021-05-03 VITALS — BP 122/71 | HR 82 | Temp 98.7°F | Resp 20 | Ht 65.0 in | Wt 119.0 lb

## 2021-05-03 DIAGNOSIS — Z832 Family history of diseases of the blood and blood-forming organs and certain disorders involving the immune mechanism: Secondary | ICD-10-CM | POA: Insufficient documentation

## 2021-05-03 DIAGNOSIS — E559 Vitamin D deficiency, unspecified: Secondary | ICD-10-CM | POA: Insufficient documentation

## 2021-05-03 DIAGNOSIS — M810 Age-related osteoporosis without current pathological fracture: Secondary | ICD-10-CM | POA: Diagnosis not present

## 2021-05-03 DIAGNOSIS — C50919 Malignant neoplasm of unspecified site of unspecified female breast: Secondary | ICD-10-CM | POA: Insufficient documentation

## 2021-05-03 DIAGNOSIS — R7989 Other specified abnormal findings of blood chemistry: Secondary | ICD-10-CM | POA: Insufficient documentation

## 2021-05-03 DIAGNOSIS — D72819 Decreased white blood cell count, unspecified: Secondary | ICD-10-CM | POA: Insufficient documentation

## 2021-05-03 DIAGNOSIS — Z853 Personal history of malignant neoplasm of breast: Secondary | ICD-10-CM | POA: Insufficient documentation

## 2021-05-03 LAB — CBC WITH DIFFERENTIAL (CANCER CENTER ONLY)
Abs Immature Granulocytes: 0.02 10*3/uL (ref 0.00–0.07)
Basophils Absolute: 0.1 10*3/uL (ref 0.0–0.1)
Basophils Relative: 1 %
Eosinophils Absolute: 0.1 10*3/uL (ref 0.0–0.5)
Eosinophils Relative: 2 %
HCT: 44.9 % (ref 36.0–46.0)
Hemoglobin: 15 g/dL (ref 12.0–15.0)
Immature Granulocytes: 0 %
Lymphocytes Relative: 29 %
Lymphs Abs: 1.7 10*3/uL (ref 0.7–4.0)
MCH: 30.4 pg (ref 26.0–34.0)
MCHC: 33.4 g/dL (ref 30.0–36.0)
MCV: 91.1 fL (ref 80.0–100.0)
Monocytes Absolute: 0.5 10*3/uL (ref 0.1–1.0)
Monocytes Relative: 9 %
Neutro Abs: 3.6 10*3/uL (ref 1.7–7.7)
Neutrophils Relative %: 59 %
Platelet Count: 266 10*3/uL (ref 150–400)
RBC: 4.93 MIL/uL (ref 3.87–5.11)
RDW: 12.9 % (ref 11.5–15.5)
WBC Count: 6.1 10*3/uL (ref 4.0–10.5)
nRBC: 0 % (ref 0.0–0.2)

## 2021-05-03 LAB — SAVE SMEAR(SSMR), FOR PROVIDER SLIDE REVIEW

## 2021-05-03 NOTE — Progress Notes (Addendum)
?  [facility] ?OFFICE PROGRESS NOTE ? ? 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 ?INTERVAL HISTORY:  ? ?Andrea Garrett was seen in an initial visit 11/23/2020 due to an elevated ferritin.  Repeat ferritin in our office was in normal range.  Hemochromatosis gene testing showed she is heterozygous for Cys282Tyr and therefore very unlikely to develop clinical hemochromatosis.  She recently contacted the office to request evaluation for a low white blood cell count. ? ?Since her visit here she was hospitalized 12/30/2020 after a fall resulting in a right proximal humerus fracture.  She underwent surgery.  CBC on the day of admission showed hemoglobin 13.8, white count 4.7, platelet count 222,000. ? ?She reports having routine labs on 03/27/2021 as part of a wellness exam.  She was able to pull up the results on her phone.  The total white count returned low at 3.8, hemoglobin and platelets in normal range.  No differential was done.  She return for repeat labs on 04/14/2021 with the white count returning at 3.7, hemoglobin and platelets normal.   ? ?Energy level has been poor since the shoulder surgery.  No fevers or sweats.  No problem with recurrent infections.  Good appetite.  No weight loss. ? ?She reports beginning over-the-counter "strontium" for osteoporosis at some point prior to the lab work in February.  She discontinued strontium about 2 weeks ago after reading online the white blood cell count could be affected. ? ?Objective: ? ?Vital signs in last 24 hours: ? ?Blood pressure 122/71, pulse 82, temperature 98.7 ?F (37.1 ?C), temperature source Oral, resp. rate 20, height $RemoveBe'5\' 5"'IBDAXpzcg$  (1.651 m), weight 119 lb (54 kg), SpO2 99 %. ?  ? ?Lymphatics: No palpable cervical, supraclavicular, axillary or inguinal lymph nodes. ?Resp: Lungs clear bilaterally. ?Cardio: Regular rate and rhythm. ?GI: No hepatosplenomegaly. ?Vascular: No leg edema. ? ? ?Lab Results: ? ?Lab Results  ?Component Value Date  ? WBC 6.1 05/03/2021   ? HGB 15.0 05/03/2021  ? HCT 44.9 05/03/2021  ? MCV 91.1 05/03/2021  ? PLT 266 05/03/2021  ? NEUTROABS 3.6 05/03/2021  ? ? ?Imaging: ? ?No results found. ? ?Medications: I have reviewed the patient's current medications. ? 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  Started neoadjuvant hormone therapy August 2013.  Right lumpectomy and sentinel lymph node sampling 02/25/2012 showing a complete pathologic response.  She reports completing approximately 1 year of anastrozole. ? ?Disposition: Ms. Andrea Garrett is stable from a hematologic standpoint.  Review of the CBC from today shows the white count is in normal range with a normal differential.  Hemoglobin and platelets are also in normal range. ? ?I recommend she follow-up with Dr. Lindell Garrett for management of osteoporosis. ? ?We discussed follow-up at the cancer center.  She does not wish to schedule an appointment at this time.  We are available to see her in the future should she develop recurrent/progressive hematologic abnormality. ? ? ? ?Ned Card ANP/GNP-BC  ? ?05/03/2021  ?2:17 PM ? ? ? ? ? ? ? ?

## 2021-05-05 DIAGNOSIS — M25511 Pain in right shoulder: Secondary | ICD-10-CM | POA: Diagnosis not present

## 2021-05-05 DIAGNOSIS — M6281 Muscle weakness (generalized): Secondary | ICD-10-CM | POA: Diagnosis not present

## 2021-05-09 DIAGNOSIS — Z1211 Encounter for screening for malignant neoplasm of colon: Secondary | ICD-10-CM | POA: Diagnosis not present

## 2021-05-10 DIAGNOSIS — M25511 Pain in right shoulder: Secondary | ICD-10-CM | POA: Diagnosis not present

## 2021-05-10 DIAGNOSIS — M6281 Muscle weakness (generalized): Secondary | ICD-10-CM | POA: Diagnosis not present

## 2021-05-17 DIAGNOSIS — M6281 Muscle weakness (generalized): Secondary | ICD-10-CM | POA: Diagnosis not present

## 2021-05-17 DIAGNOSIS — M25511 Pain in right shoulder: Secondary | ICD-10-CM | POA: Diagnosis not present

## 2021-05-19 DIAGNOSIS — M6281 Muscle weakness (generalized): Secondary | ICD-10-CM | POA: Diagnosis not present

## 2021-05-19 DIAGNOSIS — M25511 Pain in right shoulder: Secondary | ICD-10-CM | POA: Diagnosis not present

## 2021-05-24 DIAGNOSIS — M25511 Pain in right shoulder: Secondary | ICD-10-CM | POA: Diagnosis not present

## 2021-05-24 DIAGNOSIS — M6281 Muscle weakness (generalized): Secondary | ICD-10-CM | POA: Diagnosis not present

## 2021-05-26 DIAGNOSIS — M25511 Pain in right shoulder: Secondary | ICD-10-CM | POA: Diagnosis not present

## 2021-05-26 DIAGNOSIS — M6281 Muscle weakness (generalized): Secondary | ICD-10-CM | POA: Diagnosis not present

## 2021-05-31 DIAGNOSIS — M25511 Pain in right shoulder: Secondary | ICD-10-CM | POA: Diagnosis not present

## 2021-05-31 DIAGNOSIS — M6281 Muscle weakness (generalized): Secondary | ICD-10-CM | POA: Diagnosis not present

## 2021-06-02 DIAGNOSIS — M25511 Pain in right shoulder: Secondary | ICD-10-CM | POA: Diagnosis not present

## 2021-06-02 DIAGNOSIS — M6281 Muscle weakness (generalized): Secondary | ICD-10-CM | POA: Diagnosis not present

## 2021-06-07 DIAGNOSIS — M25511 Pain in right shoulder: Secondary | ICD-10-CM | POA: Diagnosis not present

## 2021-06-07 DIAGNOSIS — M6281 Muscle weakness (generalized): Secondary | ICD-10-CM | POA: Diagnosis not present

## 2021-06-07 DIAGNOSIS — S42231D 3-part fracture of surgical neck of right humerus, subsequent encounter for fracture with routine healing: Secondary | ICD-10-CM | POA: Diagnosis not present

## 2021-06-08 ENCOUNTER — Other Ambulatory Visit: Payer: Self-pay | Admitting: Orthopedic Surgery

## 2021-06-08 DIAGNOSIS — M25511 Pain in right shoulder: Secondary | ICD-10-CM

## 2021-06-09 ENCOUNTER — Ambulatory Visit
Admission: RE | Admit: 2021-06-09 | Discharge: 2021-06-09 | Disposition: A | Discharge status: Home / Self Care | Payer: Medicare Other | Source: Ambulatory Visit | Attending: Orthopedic Surgery | Admitting: Orthopedic Surgery

## 2021-06-09 DIAGNOSIS — M6281 Muscle weakness (generalized): Secondary | ICD-10-CM | POA: Diagnosis not present

## 2021-06-09 DIAGNOSIS — M25511 Pain in right shoulder: Secondary | ICD-10-CM

## 2021-06-14 DIAGNOSIS — M25511 Pain in right shoulder: Secondary | ICD-10-CM | POA: Diagnosis not present

## 2021-06-14 DIAGNOSIS — M6281 Muscle weakness (generalized): Secondary | ICD-10-CM | POA: Diagnosis not present

## 2021-06-28 DIAGNOSIS — M6281 Muscle weakness (generalized): Secondary | ICD-10-CM | POA: Diagnosis not present

## 2021-06-28 DIAGNOSIS — M25511 Pain in right shoulder: Secondary | ICD-10-CM | POA: Diagnosis not present

## 2021-07-05 DIAGNOSIS — M25511 Pain in right shoulder: Secondary | ICD-10-CM | POA: Diagnosis not present

## 2021-07-05 DIAGNOSIS — M6281 Muscle weakness (generalized): Secondary | ICD-10-CM | POA: Diagnosis not present

## 2021-07-07 DIAGNOSIS — M6281 Muscle weakness (generalized): Secondary | ICD-10-CM | POA: Diagnosis not present

## 2021-07-07 DIAGNOSIS — M25511 Pain in right shoulder: Secondary | ICD-10-CM | POA: Diagnosis not present

## 2021-07-12 DIAGNOSIS — M6281 Muscle weakness (generalized): Secondary | ICD-10-CM | POA: Diagnosis not present

## 2021-07-12 DIAGNOSIS — M25511 Pain in right shoulder: Secondary | ICD-10-CM | POA: Diagnosis not present

## 2021-07-18 DIAGNOSIS — M7501 Adhesive capsulitis of right shoulder: Secondary | ICD-10-CM | POA: Diagnosis not present

## 2021-07-18 DIAGNOSIS — M19011 Primary osteoarthritis, right shoulder: Secondary | ICD-10-CM | POA: Diagnosis not present

## 2021-07-20 ENCOUNTER — Encounter: Payer: Self-pay | Admitting: Nurse Practitioner

## 2021-07-21 ENCOUNTER — Other Ambulatory Visit: Payer: Self-pay | Admitting: *Deleted

## 2021-07-21 DIAGNOSIS — R7989 Other specified abnormal findings of blood chemistry: Secondary | ICD-10-CM

## 2021-07-24 DIAGNOSIS — M6281 Muscle weakness (generalized): Secondary | ICD-10-CM | POA: Diagnosis not present

## 2021-07-24 DIAGNOSIS — M25511 Pain in right shoulder: Secondary | ICD-10-CM | POA: Diagnosis not present

## 2021-07-27 ENCOUNTER — Inpatient Hospital Stay: Payer: Medicare Other | Attending: Nurse Practitioner

## 2021-07-27 ENCOUNTER — Other Ambulatory Visit: Payer: Self-pay

## 2021-07-27 DIAGNOSIS — R7989 Other specified abnormal findings of blood chemistry: Secondary | ICD-10-CM

## 2021-07-27 LAB — CBC WITH DIFFERENTIAL (CANCER CENTER ONLY)
Abs Immature Granulocytes: 0.01 10*3/uL (ref 0.00–0.07)
Basophils Absolute: 0 10*3/uL (ref 0.0–0.1)
Basophils Relative: 1 %
Eosinophils Absolute: 0.3 10*3/uL (ref 0.0–0.5)
Eosinophils Relative: 6 %
HCT: 43 % (ref 36.0–46.0)
Hemoglobin: 14.6 g/dL (ref 12.0–15.0)
Immature Granulocytes: 0 %
Lymphocytes Relative: 25 %
Lymphs Abs: 1.2 10*3/uL (ref 0.7–4.0)
MCH: 30.7 pg (ref 26.0–34.0)
MCHC: 34 g/dL (ref 30.0–36.0)
MCV: 90.3 fL (ref 80.0–100.0)
Monocytes Absolute: 0.6 10*3/uL (ref 0.1–1.0)
Monocytes Relative: 12 %
Neutro Abs: 2.7 10*3/uL (ref 1.7–7.7)
Neutrophils Relative %: 56 %
Platelet Count: 203 10*3/uL (ref 150–400)
RBC: 4.76 MIL/uL (ref 3.87–5.11)
RDW: 12.4 % (ref 11.5–15.5)
WBC Count: 4.8 10*3/uL (ref 4.0–10.5)
nRBC: 0 % (ref 0.0–0.2)

## 2021-07-27 LAB — FERRITIN: Ferritin: 138 ng/mL (ref 11–307)

## 2021-07-28 ENCOUNTER — Telehealth: Payer: Self-pay

## 2021-07-28 NOTE — Telephone Encounter (Signed)
-----   Message from Owens Shark, NP sent at 07/28/2021  8:31 AM EDT ----- Please let her know CBC is normal.  Also ferritin stable in normal range.

## 2021-07-28 NOTE — Telephone Encounter (Signed)
Patient gave verbal understanding and had no further questions or concerns  

## 2021-08-03 DIAGNOSIS — L219 Seborrheic dermatitis, unspecified: Secondary | ICD-10-CM | POA: Diagnosis not present

## 2021-08-03 DIAGNOSIS — L989 Disorder of the skin and subcutaneous tissue, unspecified: Secondary | ICD-10-CM | POA: Diagnosis not present

## 2021-08-04 DIAGNOSIS — M25511 Pain in right shoulder: Secondary | ICD-10-CM | POA: Diagnosis not present

## 2021-08-04 DIAGNOSIS — M6281 Muscle weakness (generalized): Secondary | ICD-10-CM | POA: Diagnosis not present

## 2021-08-11 DIAGNOSIS — M6281 Muscle weakness (generalized): Secondary | ICD-10-CM | POA: Diagnosis not present

## 2021-08-11 DIAGNOSIS — M25511 Pain in right shoulder: Secondary | ICD-10-CM | POA: Diagnosis not present

## 2021-08-28 DIAGNOSIS — M7501 Adhesive capsulitis of right shoulder: Secondary | ICD-10-CM | POA: Diagnosis not present

## 2021-08-28 DIAGNOSIS — M19011 Primary osteoarthritis, right shoulder: Secondary | ICD-10-CM | POA: Diagnosis not present

## 2021-08-30 DIAGNOSIS — M25511 Pain in right shoulder: Secondary | ICD-10-CM | POA: Diagnosis not present

## 2021-08-30 DIAGNOSIS — M6281 Muscle weakness (generalized): Secondary | ICD-10-CM | POA: Diagnosis not present

## 2021-09-01 DIAGNOSIS — M6281 Muscle weakness (generalized): Secondary | ICD-10-CM | POA: Diagnosis not present

## 2021-09-01 DIAGNOSIS — M25511 Pain in right shoulder: Secondary | ICD-10-CM | POA: Diagnosis not present

## 2021-09-19 DIAGNOSIS — M7501 Adhesive capsulitis of right shoulder: Secondary | ICD-10-CM | POA: Diagnosis not present

## 2021-09-19 DIAGNOSIS — M19011 Primary osteoarthritis, right shoulder: Secondary | ICD-10-CM | POA: Diagnosis not present

## 2021-10-30 DIAGNOSIS — H43813 Vitreous degeneration, bilateral: Secondary | ICD-10-CM | POA: Diagnosis not present

## 2021-10-30 DIAGNOSIS — H52203 Unspecified astigmatism, bilateral: Secondary | ICD-10-CM | POA: Diagnosis not present

## 2021-10-30 DIAGNOSIS — H524 Presbyopia: Secondary | ICD-10-CM | POA: Diagnosis not present

## 2021-10-30 DIAGNOSIS — H5203 Hypermetropia, bilateral: Secondary | ICD-10-CM | POA: Diagnosis not present

## 2021-11-01 DIAGNOSIS — S42231D 3-part fracture of surgical neck of right humerus, subsequent encounter for fracture with routine healing: Secondary | ICD-10-CM | POA: Diagnosis not present

## 2021-11-13 DIAGNOSIS — M19011 Primary osteoarthritis, right shoulder: Secondary | ICD-10-CM | POA: Diagnosis not present

## 2021-11-13 DIAGNOSIS — M7501 Adhesive capsulitis of right shoulder: Secondary | ICD-10-CM | POA: Diagnosis not present

## 2022-02-08 DIAGNOSIS — D1801 Hemangioma of skin and subcutaneous tissue: Secondary | ICD-10-CM | POA: Diagnosis not present

## 2022-02-08 DIAGNOSIS — L821 Other seborrheic keratosis: Secondary | ICD-10-CM | POA: Diagnosis not present

## 2022-02-08 DIAGNOSIS — L814 Other melanin hyperpigmentation: Secondary | ICD-10-CM | POA: Diagnosis not present

## 2022-02-08 DIAGNOSIS — I788 Other diseases of capillaries: Secondary | ICD-10-CM | POA: Diagnosis not present

## 2022-03-18 ENCOUNTER — Encounter: Payer: Self-pay | Admitting: Nurse Practitioner

## 2022-03-21 ENCOUNTER — Encounter: Payer: Self-pay | Admitting: *Deleted

## 2022-03-29 DIAGNOSIS — Z148 Genetic carrier of other disease: Secondary | ICD-10-CM | POA: Diagnosis not present

## 2022-03-29 DIAGNOSIS — Z79899 Other long term (current) drug therapy: Secondary | ICD-10-CM | POA: Diagnosis not present

## 2022-03-29 DIAGNOSIS — E7800 Pure hypercholesterolemia, unspecified: Secondary | ICD-10-CM | POA: Diagnosis not present

## 2022-03-29 DIAGNOSIS — M810 Age-related osteoporosis without current pathological fracture: Secondary | ICD-10-CM | POA: Diagnosis not present

## 2022-03-29 DIAGNOSIS — Z8249 Family history of ischemic heart disease and other diseases of the circulatory system: Secondary | ICD-10-CM | POA: Diagnosis not present

## 2022-03-29 DIAGNOSIS — Z1211 Encounter for screening for malignant neoplasm of colon: Secondary | ICD-10-CM | POA: Diagnosis not present

## 2022-03-29 DIAGNOSIS — Z0000 Encounter for general adult medical examination without abnormal findings: Secondary | ICD-10-CM | POA: Diagnosis not present

## 2022-04-02 DIAGNOSIS — Z1211 Encounter for screening for malignant neoplasm of colon: Secondary | ICD-10-CM | POA: Diagnosis not present

## 2022-04-17 ENCOUNTER — Other Ambulatory Visit (HOSPITAL_COMMUNITY): Payer: Self-pay | Admitting: Family Medicine

## 2022-04-17 DIAGNOSIS — Z8249 Family history of ischemic heart disease and other diseases of the circulatory system: Secondary | ICD-10-CM

## 2022-04-17 DIAGNOSIS — E7800 Pure hypercholesterolemia, unspecified: Secondary | ICD-10-CM

## 2022-05-08 DIAGNOSIS — M25512 Pain in left shoulder: Secondary | ICD-10-CM | POA: Diagnosis not present

## 2022-05-08 DIAGNOSIS — M7501 Adhesive capsulitis of right shoulder: Secondary | ICD-10-CM | POA: Diagnosis not present

## 2022-05-08 DIAGNOSIS — M19011 Primary osteoarthritis, right shoulder: Secondary | ICD-10-CM | POA: Diagnosis not present

## 2022-05-23 DIAGNOSIS — M19011 Primary osteoarthritis, right shoulder: Secondary | ICD-10-CM | POA: Diagnosis not present

## 2022-05-25 ENCOUNTER — Encounter (HOSPITAL_BASED_OUTPATIENT_CLINIC_OR_DEPARTMENT_OTHER): Payer: Self-pay

## 2022-05-25 ENCOUNTER — Ambulatory Visit (HOSPITAL_BASED_OUTPATIENT_CLINIC_OR_DEPARTMENT_OTHER): Payer: Medicare Other

## 2022-08-08 DIAGNOSIS — N390 Urinary tract infection, site not specified: Secondary | ICD-10-CM | POA: Diagnosis not present

## 2022-09-13 DIAGNOSIS — D125 Benign neoplasm of sigmoid colon: Secondary | ICD-10-CM | POA: Diagnosis not present

## 2022-09-13 DIAGNOSIS — K5730 Diverticulosis of large intestine without perforation or abscess without bleeding: Secondary | ICD-10-CM | POA: Diagnosis not present

## 2022-09-13 DIAGNOSIS — D120 Benign neoplasm of cecum: Secondary | ICD-10-CM | POA: Diagnosis not present

## 2022-09-13 DIAGNOSIS — Z1211 Encounter for screening for malignant neoplasm of colon: Secondary | ICD-10-CM | POA: Diagnosis not present

## 2022-09-13 DIAGNOSIS — D122 Benign neoplasm of ascending colon: Secondary | ICD-10-CM | POA: Diagnosis not present

## 2022-09-17 DIAGNOSIS — E559 Vitamin D deficiency, unspecified: Secondary | ICD-10-CM | POA: Diagnosis not present

## 2022-09-17 DIAGNOSIS — R5383 Other fatigue: Secondary | ICD-10-CM | POA: Diagnosis not present

## 2022-09-17 DIAGNOSIS — R898 Other abnormal findings in specimens from other organs, systems and tissues: Secondary | ICD-10-CM | POA: Diagnosis not present

## 2022-09-18 DIAGNOSIS — D125 Benign neoplasm of sigmoid colon: Secondary | ICD-10-CM | POA: Diagnosis not present

## 2022-11-01 ENCOUNTER — Encounter: Payer: Self-pay | Admitting: Family Medicine

## 2022-11-01 DIAGNOSIS — H52203 Unspecified astigmatism, bilateral: Secondary | ICD-10-CM | POA: Diagnosis not present

## 2022-11-01 DIAGNOSIS — H5203 Hypermetropia, bilateral: Secondary | ICD-10-CM | POA: Diagnosis not present

## 2022-11-01 DIAGNOSIS — H2513 Age-related nuclear cataract, bilateral: Secondary | ICD-10-CM | POA: Diagnosis not present

## 2023-03-28 DIAGNOSIS — E7800 Pure hypercholesterolemia, unspecified: Secondary | ICD-10-CM | POA: Diagnosis not present

## 2023-03-28 DIAGNOSIS — Z79899 Other long term (current) drug therapy: Secondary | ICD-10-CM | POA: Diagnosis not present

## 2023-04-05 ENCOUNTER — Other Ambulatory Visit: Payer: Self-pay | Admitting: Family Medicine

## 2023-04-05 ENCOUNTER — Other Ambulatory Visit (HOSPITAL_COMMUNITY): Payer: Self-pay | Admitting: Family Medicine

## 2023-04-05 DIAGNOSIS — Z79899 Other long term (current) drug therapy: Secondary | ICD-10-CM | POA: Diagnosis not present

## 2023-04-05 DIAGNOSIS — Z8249 Family history of ischemic heart disease and other diseases of the circulatory system: Secondary | ICD-10-CM

## 2023-04-05 DIAGNOSIS — Z0000 Encounter for general adult medical examination without abnormal findings: Secondary | ICD-10-CM | POA: Diagnosis not present

## 2023-04-05 DIAGNOSIS — E7800 Pure hypercholesterolemia, unspecified: Secondary | ICD-10-CM | POA: Diagnosis not present

## 2023-04-05 DIAGNOSIS — Z148 Genetic carrier of other disease: Secondary | ICD-10-CM | POA: Diagnosis not present

## 2023-04-05 DIAGNOSIS — Z853 Personal history of malignant neoplasm of breast: Secondary | ICD-10-CM | POA: Diagnosis not present

## 2023-04-05 DIAGNOSIS — M810 Age-related osteoporosis without current pathological fracture: Secondary | ICD-10-CM | POA: Diagnosis not present

## 2023-04-05 DIAGNOSIS — E2839 Other primary ovarian failure: Secondary | ICD-10-CM

## 2023-04-08 ENCOUNTER — Other Ambulatory Visit: Payer: Self-pay | Admitting: Family Medicine

## 2023-04-08 DIAGNOSIS — Z9882 Breast implant status: Secondary | ICD-10-CM

## 2023-04-08 DIAGNOSIS — Z853 Personal history of malignant neoplasm of breast: Secondary | ICD-10-CM

## 2023-04-23 ENCOUNTER — Ambulatory Visit
Admission: RE | Admit: 2023-04-23 | Discharge: 2023-04-23 | Disposition: A | Discharge status: Home / Self Care | Source: Ambulatory Visit | Attending: Family Medicine | Admitting: Family Medicine

## 2023-04-23 DIAGNOSIS — N958 Other specified menopausal and perimenopausal disorders: Secondary | ICD-10-CM | POA: Diagnosis not present

## 2023-04-23 DIAGNOSIS — E2839 Other primary ovarian failure: Secondary | ICD-10-CM

## 2023-04-23 DIAGNOSIS — M8588 Other specified disorders of bone density and structure, other site: Secondary | ICD-10-CM | POA: Diagnosis not present

## 2023-04-30 DIAGNOSIS — M25512 Pain in left shoulder: Secondary | ICD-10-CM | POA: Diagnosis not present

## 2023-04-30 DIAGNOSIS — M810 Age-related osteoporosis without current pathological fracture: Secondary | ICD-10-CM | POA: Diagnosis not present

## 2023-05-05 ENCOUNTER — Inpatient Hospital Stay: Admission: RE | Admit: 2023-05-05 | Payer: Medicare Other | Source: Ambulatory Visit

## 2023-05-16 DIAGNOSIS — E7800 Pure hypercholesterolemia, unspecified: Secondary | ICD-10-CM | POA: Diagnosis not present

## 2023-05-16 DIAGNOSIS — E559 Vitamin D deficiency, unspecified: Secondary | ICD-10-CM | POA: Diagnosis not present

## 2023-05-16 DIAGNOSIS — Z853 Personal history of malignant neoplasm of breast: Secondary | ICD-10-CM | POA: Diagnosis not present

## 2023-05-16 DIAGNOSIS — M810 Age-related osteoporosis without current pathological fracture: Secondary | ICD-10-CM | POA: Diagnosis not present

## 2023-05-20 DIAGNOSIS — M810 Age-related osteoporosis without current pathological fracture: Secondary | ICD-10-CM | POA: Diagnosis not present

## 2023-05-21 DIAGNOSIS — M19111 Post-traumatic osteoarthritis, right shoulder: Secondary | ICD-10-CM | POA: Diagnosis not present

## 2023-05-21 DIAGNOSIS — M19112 Post-traumatic osteoarthritis, left shoulder: Secondary | ICD-10-CM | POA: Diagnosis not present

## 2023-05-22 ENCOUNTER — Other Ambulatory Visit: Payer: Self-pay | Admitting: Orthopaedic Surgery

## 2023-05-22 ENCOUNTER — Ambulatory Visit (HOSPITAL_BASED_OUTPATIENT_CLINIC_OR_DEPARTMENT_OTHER)
Admission: RE | Admit: 2023-05-22 | Discharge: 2023-05-22 | Disposition: A | Discharge status: Home / Self Care | Payer: Medicare Other | Source: Ambulatory Visit | Attending: Family Medicine | Admitting: Family Medicine

## 2023-05-22 DIAGNOSIS — Z8249 Family history of ischemic heart disease and other diseases of the circulatory system: Secondary | ICD-10-CM | POA: Insufficient documentation

## 2023-05-22 DIAGNOSIS — E7800 Pure hypercholesterolemia, unspecified: Secondary | ICD-10-CM | POA: Insufficient documentation

## 2023-05-22 DIAGNOSIS — M25511 Pain in right shoulder: Secondary | ICD-10-CM

## 2023-06-05 ENCOUNTER — Ambulatory Visit
Admission: RE | Admit: 2023-06-05 | Discharge: 2023-06-05 | Disposition: A | Discharge status: Home / Self Care | Source: Ambulatory Visit | Attending: Orthopaedic Surgery | Admitting: Orthopaedic Surgery

## 2023-06-05 DIAGNOSIS — M19011 Primary osteoarthritis, right shoulder: Secondary | ICD-10-CM | POA: Diagnosis not present

## 2023-06-05 DIAGNOSIS — G8929 Other chronic pain: Secondary | ICD-10-CM

## 2023-06-05 DIAGNOSIS — M25511 Pain in right shoulder: Secondary | ICD-10-CM | POA: Diagnosis not present

## 2023-10-24 DIAGNOSIS — E7800 Pure hypercholesterolemia, unspecified: Secondary | ICD-10-CM | POA: Diagnosis not present

## 2023-10-24 DIAGNOSIS — R634 Abnormal weight loss: Secondary | ICD-10-CM | POA: Diagnosis not present

## 2023-10-24 DIAGNOSIS — E559 Vitamin D deficiency, unspecified: Secondary | ICD-10-CM | POA: Diagnosis not present

## 2023-10-24 DIAGNOSIS — Z148 Genetic carrier of other disease: Secondary | ICD-10-CM | POA: Diagnosis not present

## 2023-10-24 DIAGNOSIS — M810 Age-related osteoporosis without current pathological fracture: Secondary | ICD-10-CM | POA: Diagnosis not present

## 2023-10-24 DIAGNOSIS — Z853 Personal history of malignant neoplasm of breast: Secondary | ICD-10-CM | POA: Diagnosis not present

## 2023-11-01 DIAGNOSIS — H35372 Puckering of macula, left eye: Secondary | ICD-10-CM | POA: Diagnosis not present

## 2023-11-01 DIAGNOSIS — H5203 Hypermetropia, bilateral: Secondary | ICD-10-CM | POA: Diagnosis not present

## 2023-11-01 DIAGNOSIS — H02831 Dermatochalasis of right upper eyelid: Secondary | ICD-10-CM | POA: Diagnosis not present

## 2023-11-01 DIAGNOSIS — H52203 Unspecified astigmatism, bilateral: Secondary | ICD-10-CM | POA: Diagnosis not present

## 2023-11-14 DIAGNOSIS — Z853 Personal history of malignant neoplasm of breast: Secondary | ICD-10-CM | POA: Diagnosis not present

## 2023-11-14 DIAGNOSIS — E559 Vitamin D deficiency, unspecified: Secondary | ICD-10-CM | POA: Diagnosis not present

## 2023-11-14 DIAGNOSIS — E7800 Pure hypercholesterolemia, unspecified: Secondary | ICD-10-CM | POA: Diagnosis not present

## 2023-11-14 DIAGNOSIS — M810 Age-related osteoporosis without current pathological fracture: Secondary | ICD-10-CM | POA: Diagnosis not present

## 2023-11-20 DIAGNOSIS — M810 Age-related osteoporosis without current pathological fracture: Secondary | ICD-10-CM | POA: Diagnosis not present

## 2023-11-20 DIAGNOSIS — M79672 Pain in left foot: Secondary | ICD-10-CM | POA: Diagnosis not present

## 2023-12-05 ENCOUNTER — Other Ambulatory Visit (HOSPITAL_BASED_OUTPATIENT_CLINIC_OR_DEPARTMENT_OTHER): Payer: Self-pay

## 2023-12-05 MED ORDER — BOOSTRIX 5-2.5-18.5 LF-MCG/0.5 IM SUSY
0.5000 mL | PREFILLED_SYRINGE | Freq: Once | INTRAMUSCULAR | 0 refills | Status: AC
  Filled 2023-12-05: qty 0.5, 1d supply, fill #0

## 2023-12-12 ENCOUNTER — Other Ambulatory Visit

## 2024-04-11 IMAGING — CT CT SHOULDER*R* W/O CM
1 of 3 series · 7 of 14 positions shown, 9 images · non-contrast
Comparison: Right shoulder radiographs 12/30/2020 and right humerus
radiographs 12/20/2020; CT right shoulder 12/26/2020

CLINICAL DATA: Right shoulder pain. Fall [DATE]. Proximal right
humeral fracture status post ORIF. History of right breast cancer
status post right lumpectomy.

EXAM:
CT OF THE UPPER RIGHT EXTREMITY WITHOUT CONTRAST
TECHNIQUE: Multidetector CT imaging of the upper right extremity was performed
according to the standard protocol.
RADIATION DOSE REDUCTION: This exam was performed according to the
departmental dose-optimization program which includes automated
exposure control, adjustment of the mA and/or kV according to
patient size and/or use of iterative reconstruction technique.

[Series 5: thin soft (person_name) · axial · 0.53mm/px · z∈[+712,+869]mm · 7 of 350 slices shown, 9 images]
[im 44/350  soft-tissue]
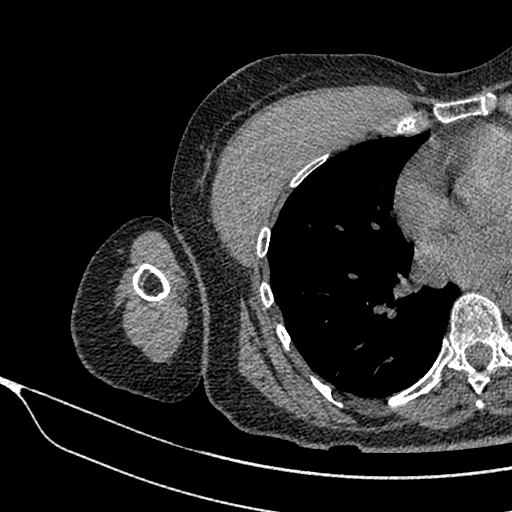
[im 44/350  bone]
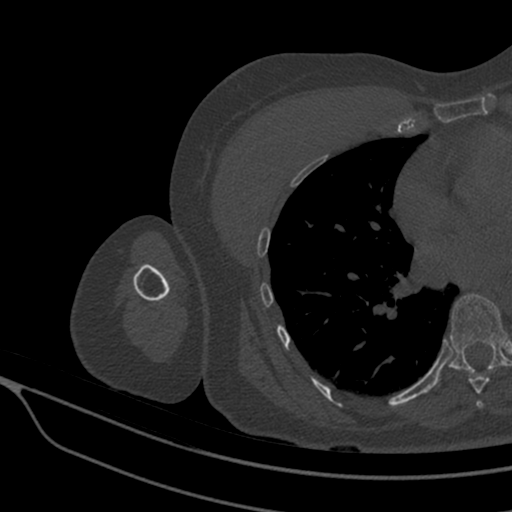
[im 88/350  bone]
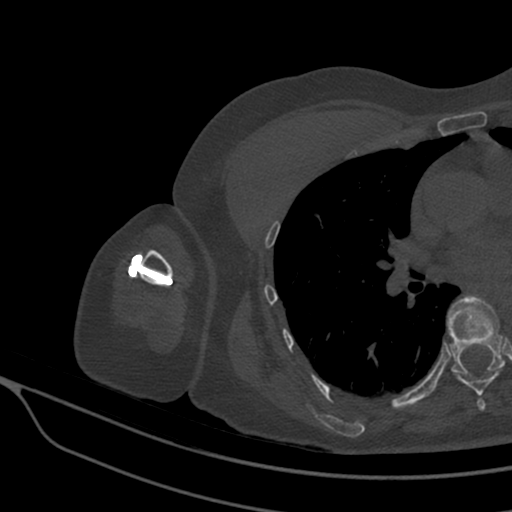
[im 131/350  bone]
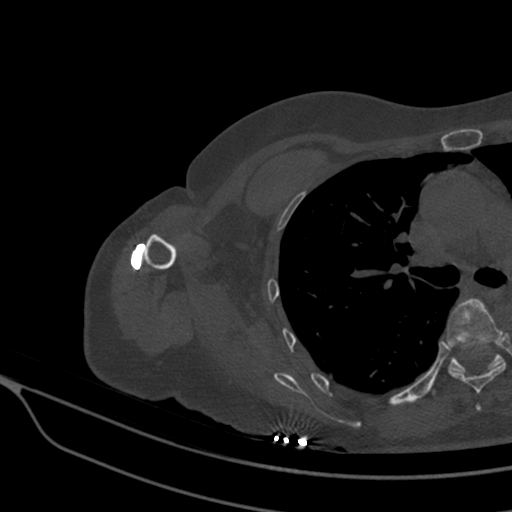
[im 175/350  bone]
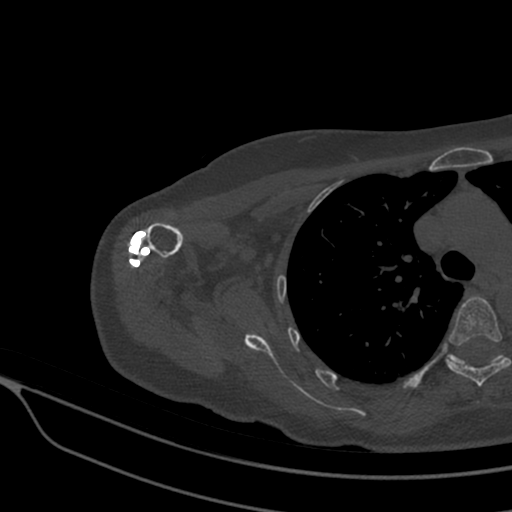
[im 219/350  soft-tissue]
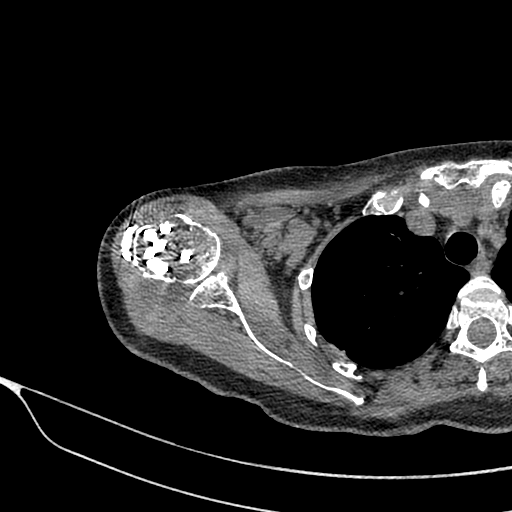
[im 219/350  bone]
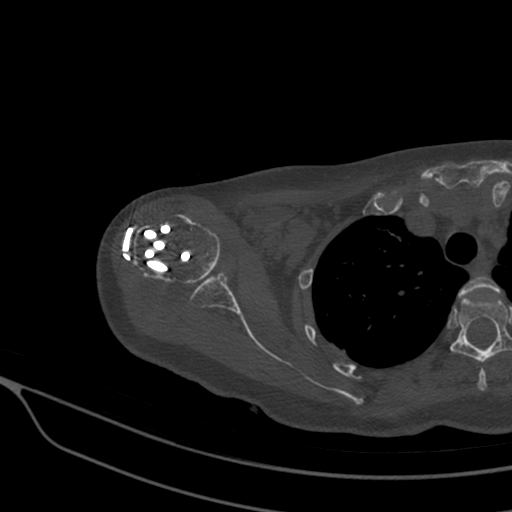
[im 262/350  bone]
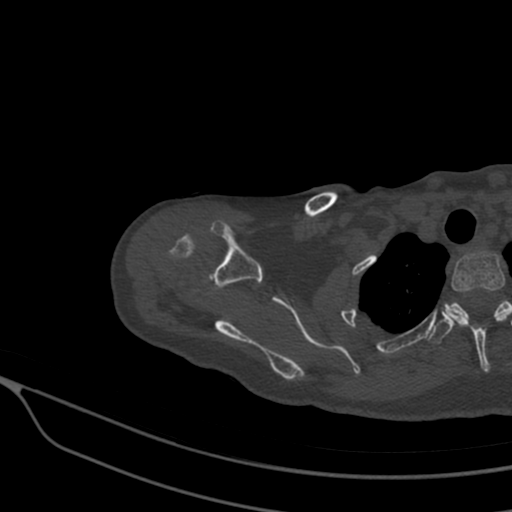
[im 306/350  bone]
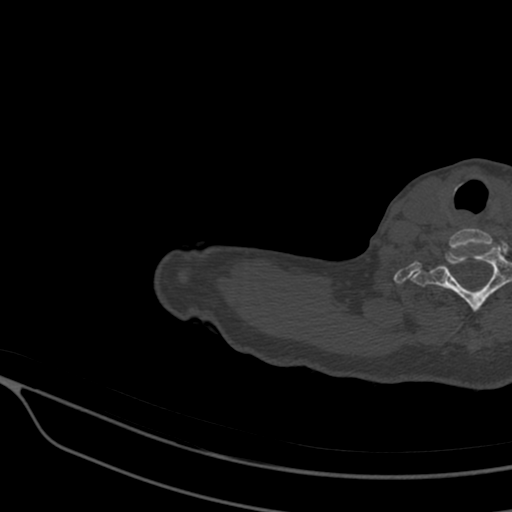

[7 of 14 positions shown; findings below may reference images not displayed]

FINDINGS: Bones/Joint/Cartilage

There is diffuse decreased bone mineralization. The lateral plate
and screw fixation of the previously seen comminuted proximal
humeral fracture including fracture lines through the greater
tuberosity and surgical neck. Mild lateral and anterior displacement
of the humeral shaft with respect to the humeral head, unchanged to
mildly improved from 12/26/2020 CT.

The second most inferior screw traverses the posterior proximal
humeral diaphyseal cortex and partially extends posterior to the
through 27). Otherwise, no perihardware lucency is seen to indicate
hardware failure or loosening.

There is again moderately displaced fracture of the anterior
inferior glenoid with a 4 x 4 x 20 mm anterior inferior bone
fragment minimally extending anteriorly and medially, with the
displacement unchanged to mildly improved from prior. Mild inferior
healing sclerosis is new from prior.

Mild osteoarthritis of the acromioclavicular joint.

Ligaments

Suboptimally assessed by CT.

Muscles and Tendons

Borderline mild supraspinatus muscle atrophy, unchanged from prior.

Soft tissues

No axillary lymphadenopathy.  No subacromial/subdeltoid bursitis.

No significant abnormality is seen within the partially visualized
superior right lung.
IMPRESSION: :
IMPRESSION: 1. Interval ORIF of proximal humeral fracture with unchanged to
minimally improved mild anterior and lateral displacement of the
humeral shaft with respect to the humeral head. The second most
inferior screw traverses the posterior proximal humeral diaphyseal
cortex and partially extends posterior to the cortex. Otherwise, no
perihardware lucency is seen to indicate hardware failure or
loosening
2. Partially displaced anterior inferior glenoid fracture, similar
in position to prior. Mild new partial healing sclerosis inferiorly.
